# Patient Record
Sex: Female | Born: 1975 | Hispanic: No | Marital: Single | Smoking: Never smoker
Health system: Southern US, Community
[De-identification: ages and names within clinical notes are randomized; demographics above are authoritative.]

## PROBLEM LIST (undated history)

## (undated) DIAGNOSIS — F419 Anxiety disorder, unspecified: Secondary | ICD-10-CM

## (undated) DIAGNOSIS — D649 Anemia, unspecified: Secondary | ICD-10-CM

## (undated) DIAGNOSIS — B999 Unspecified infectious disease: Secondary | ICD-10-CM

## (undated) DIAGNOSIS — R87629 Unspecified abnormal cytological findings in specimens from vagina: Secondary | ICD-10-CM

## (undated) DIAGNOSIS — K819 Cholecystitis, unspecified: Secondary | ICD-10-CM

## (undated) HISTORY — PX: TYMPANOSTOMY TUBE PLACEMENT: SHX32

---

## 2015-10-07 ENCOUNTER — Inpatient Hospital Stay (HOSPITAL_COMMUNITY)
Admission: AD | Admit: 2015-10-07 | Discharge: 2015-10-07 | Disposition: A | Discharge status: Home / Self Care | Payer: BLUE CROSS/BLUE SHIELD | Source: Ambulatory Visit | Attending: Obstetrics and Gynecology | Admitting: Obstetrics and Gynecology

## 2015-10-07 ENCOUNTER — Encounter (HOSPITAL_COMMUNITY): Payer: Self-pay | Admitting: *Deleted

## 2015-10-07 DIAGNOSIS — N939 Abnormal uterine and vaginal bleeding, unspecified: Secondary | ICD-10-CM | POA: Insufficient documentation

## 2015-10-07 DIAGNOSIS — N926 Irregular menstruation, unspecified: Secondary | ICD-10-CM | POA: Diagnosis not present

## 2015-10-07 HISTORY — DX: Anxiety disorder, unspecified: F41.9

## 2015-10-07 HISTORY — DX: Unspecified infectious disease: B99.9

## 2015-10-07 HISTORY — DX: Unspecified abnormal cytological findings in specimens from vagina: R87.629

## 2015-10-07 HISTORY — DX: Anemia, unspecified: D64.9

## 2015-10-07 LAB — URINALYSIS, ROUTINE W REFLEX MICROSCOPIC
BILIRUBIN URINE: NEGATIVE
Glucose, UA: NEGATIVE mg/dL
Ketones, ur: NEGATIVE mg/dL
NITRITE: NEGATIVE
PH: 5.5 (ref 5.0–8.0)
Protein, ur: 30 mg/dL — AB
SPECIFIC GRAVITY, URINE: 1.02 (ref 1.005–1.030)

## 2015-10-07 LAB — URINE MICROSCOPIC-ADD ON

## 2015-10-07 LAB — POCT PREGNANCY, URINE: Preg Test, Ur: NEGATIVE

## 2015-10-07 NOTE — MAU Provider Note (Signed)
MAU HISTORY AND PHYSICAL  Chief Complaint:  Vaginal Bleeding   Amber Parks is a 39 y.o.  G0P0000 presenting for Vaginal Bleeding  Yesterday passed a tangerine-sized clot with menses. Thought there might have been tissue in it to suggest pregnancy. Often has clots with menses. On OCPs for approximately one month ago. Prescribed by [facility] family physicians here in town. This is the placebo week. Bleeding this week is actually less than normal, "far less than what it was prior to starting the pill." Normal period cramping. No lightheadedness or syncope or palpitations. Is compliant with OCPs. One sexual partner, female. Today just spotting.  She shows me the clot in a plastic bag, it is quarter sized.  Past Medical History  Diagnosis Date  . Anemia   . Infection     UTI  . Anxiety   . Vaginal Pap smear, abnormal     f/u ok    Past Surgical History  Procedure Laterality Date  . Tympanostomy tube placement      Family History  Problem Relation Age of Onset  . Hypertension Mother   . Stroke Mother   . Hyperlipidemia Father   . Hypertension Maternal Grandmother   . Pulmonary fibrosis Maternal Grandmother     Social History  Substance Use Topics  . Smoking status: Never Smoker   . Smokeless tobacco: Never Used  . Alcohol Use: No    Allergies not on file  No prescriptions prior to admission    Review of Systems - Negative except for what is mentioned in HPI.  Physical Exam  Blood pressure 141/92, pulse 102, temperature 98.4 F (36.9 C), temperature source Oral, resp. rate 18, height 5' (1.524 m), weight 162 lb (73.483 kg), last menstrual period 10/04/2015. GENERAL: Well-developed, well-nourished female in no acute distress.  LUNGS: Clear to auscultation bilaterally.  HEART: Regular rate and rhythm. ABDOMEN: Soft, nontender, nondistended, gravid.  EXTREMITIES: Nontender, no edema    Labs: Results for orders placed or performed during the hospital encounter of 10/07/15  (from the past 24 hour(s))  Urinalysis, Routine w reflex microscopic (not at [facility])   Collection Time: 10/07/15  9:45 AM  Result Value Ref Range   Color, Urine RED (A) YELLOW   APPearance HAZY (A) CLEAR   Specific Gravity, Urine 1.020 1.005 - 1.030   pH 5.5 5.0 - 8.0   Glucose, UA NEGATIVE NEGATIVE mg/dL   Hgb urine dipstick LARGE (A) NEGATIVE   Bilirubin Urine NEGATIVE NEGATIVE   Ketones, ur NEGATIVE NEGATIVE mg/dL   Protein, ur 30 (A) NEGATIVE mg/dL   Nitrite NEGATIVE NEGATIVE   Leukocytes, UA TRACE (A) NEGATIVE  Urine microscopic-add on   Collection Time: 10/07/15  9:45 AM  Result Value Ref Range   Squamous Epithelial / LPF 0-5 (A) NONE SEEN   WBC, UA 0-5 0 - 5 WBC/hpf   RBC / HPF TOO NUMEROUS TO COUNT 0 - 5 RBC/hpf   Bacteria, UA RARE (A) NONE SEEN  Pregnancy, urine POC   Collection Time: 10/07/15  9:50 AM  Result Value Ref Range   Preg Test, Ur NEGATIVE NEGATIVE    Imaging Studies:  No results found.  Assessment: Amber Parks is  39 y.o. G0P0000 who presents with concern for SAB. This appears to be normal menstruation as has been compliant with OCPs, total menstruation this week is less than normal, and Upreg is negative. Thought she saw a fetus in small blood clot she showed me; I explained that a UPT would assuredly be  positive if gestation far enough along for recognizable fetus to be present. Reports history heavy periods; says this period less heavy than prior to starting OCPs, no significant vital sign instability, so do not think this represents hemodynamically significant uterine bleeding.  Plan: - bleeding return precautions - continue OCPs - PCP f/u  Silvano BilisNoah B Wouk 12/26/201610:27 AM

## 2015-10-07 NOTE — Discharge Instructions (Signed)

## 2015-10-07 NOTE — MAU Note (Signed)
Started on birth control about a month ago.  Last night when used the restroom, passed a large tissue like clot. Was spotting prior.  Is still bleeding. This is her off week of birth control

## 2017-04-13 ENCOUNTER — Ambulatory Visit (INDEPENDENT_AMBULATORY_CARE_PROVIDER_SITE_OTHER): Payer: BLUE CROSS/BLUE SHIELD | Admitting: Allergy and Immunology

## 2017-04-13 ENCOUNTER — Encounter: Payer: Self-pay | Admitting: Allergy and Immunology

## 2017-04-13 VITALS — BP 138/72 | HR 80 | Temp 98.7°F | Resp 18 | Ht 60.0 in | Wt 181.0 lb

## 2017-04-13 DIAGNOSIS — K219 Gastro-esophageal reflux disease without esophagitis: Secondary | ICD-10-CM

## 2017-04-13 DIAGNOSIS — J4520 Mild intermittent asthma, uncomplicated: Secondary | ICD-10-CM

## 2017-04-13 DIAGNOSIS — J3089 Other allergic rhinitis: Secondary | ICD-10-CM

## 2017-04-13 DIAGNOSIS — Z91018 Allergy to other foods: Secondary | ICD-10-CM | POA: Diagnosis not present

## 2017-04-13 MED ORDER — MONTELUKAST SODIUM 10 MG PO TABS: ORAL_TABLET | ORAL | 5 refills | Status: DC

## 2017-04-13 MED ORDER — OMEPRAZOLE 40 MG PO CPDR: 40.0000 mg | DELAYED_RELEASE_CAPSULE | ORAL | 5 refills | Status: DC

## 2017-04-13 MED ORDER — RANITIDINE HCL 300 MG PO TABS: 300.0000 mg | ORAL_TABLET | Freq: Every day | ORAL | 5 refills | Status: DC

## 2017-04-13 MED ORDER — AUVI-Q 0.3 MG/0.3ML IJ SOAJ: INTRAMUSCULAR | 3 refills | Status: AC

## 2017-04-13 NOTE — Progress Notes (Signed)
NEW PATIENT NOTE  Referring Provider: No ref. provider found Primary Provider: Ilona Sorrel, NP Date of office visit: 04/13/2017    Subjective:   Chief Complaint:  Amber Parks (DOB: 06/27/1976) is a 41 y.o. female who presents to the clinic on 04/13/2017 with a chief complaint of Cough (Mucus in throat after eating.) .  HPI: Amber Parks presents to this clinic in evaluation of 4 main issues.  First, she has nasal congestion and sneezing and nose blowing that appears to occur throughout the year and maybe somewhat worse during the spring. There does not appear to be any obvious provoking factor giving rise to this issue. She utilizes a combination of Flonase and Allegra which appears to help her somewhat. She does not have any associated ugly nasal discharge or anosmia or headaches.  Second, she has a history of developing postnasal drip and throat clearing and gagging and retching and vomiting in association with coughing spells. She does not have any shortness of breath or chest pain or sputum production. She thinks that this is somewhat helped by using Allegra but this still occurs at least one time per day. She thinks that she is worse when she is eating. Spicy foods appear to precipitate this issue. She drinks 32 ounces of tea per day and does not really consume any chocolate or alcohol.  Third, she was apparently given a bronchodilator this May during a respiratory tract infection that was associated with a fever. Otherwise, she does not really have any cold air induced bronchospastic symptoms or exercise induced bronchospastic symptoms  Fourth, she has a history of developing a reaction to pineapple. She has been pineapple free for the past year. When she eats pineapple she gets diffuse itchiness and itchiness of her tongue.  Past Medical History:  Diagnosis Date  . Anemia   . Anxiety   . Infection    UTI  . Vaginal Pap smear, abnormal    f/u ok    Past Surgical  History:  Procedure Laterality Date  . TYMPANOSTOMY TUBE PLACEMENT      Allergies as of 04/13/2017      Reactions   Moxifloxacin Anxiety   Aspirin Nausea And Vomiting   Chlorthalidone Other (See Comments)   Increased heart rate   Sulfamethoxazole-trimethoprim Nausea And Vomiting   Zoloft [sertraline] Other (See Comments)   Took 1 pill and caused several different problems, HIGHLY allergic! Gives MS symptoms   Ibuprofen Nausea Only      Medication List      buPROPion 150 MG 24 hr tablet Commonly known as:  WELLBUTRIN XL Take 150 mg by mouth daily.   calcium citrate-vitamin D 315-200 MG-UNIT tablet Commonly known as:  CITRACAL+D Take 1 tablet by mouth daily.   clotrimazole-betamethasone cream Commonly known as:  LOTRISONE Apply topically.   Cranberry-Vitamin C-Vitamin E 140-100-3 MG-MG-UNIT Caps Take 2 by mouth daily   desonide 0.05 % cream Commonly known as:  DESOWEN Two (2) times a day. Apply to affected area.   ferrous sulfate 325 (65 FE) MG tablet Take 325 mg by mouth 2 (two) times daily with a meal.   fexofenadine 180 MG tablet Commonly known as:  ALLEGRA Take 180 mg by mouth daily.   FLONASE SENSIMIST 27.5 MCG/SPRAY nasal spray Generic drug:  fluticasone Place 1 spray into the nose daily.   gabapentin 100 MG capsule Commonly known as:  NEURONTIN TK ONE C PO D   losartan 25 MG tablet Commonly known as:  COZAAR Take 1 tablet by mouth daily.   magic mouthwash Soln 80ml each: Viscous Lidocaine, Mylanta, Benadryl 12.5mg /45ml, Nystatin Oral Susp, Prednisolone 15mg /335ml & Carafate 1gr/4710ml; 10ml q4h prn   NIKKI 3-0.02 MG tablet Generic drug:  drospirenone-ethinyl estradiol TAKE 1 TABLET BY MOUTH EVERY DAY   PROAIR HFA 108 (90 Base) MCG/ACT inhaler Generic drug:  albuterol Inhale into the lungs.   Vitamin B-12 1000 MCG Subl Take one by mouth daily   vitamin C 500 MG tablet Commonly known as:  ASCORBIC ACID Take 500 mg by mouth daily.   Vitamin D  (Ergocalciferol) 50000 units Caps capsule Commonly known as:  DRISDOL TK 1 C PO 3 TIMES A WK       Review of systems negative except as noted in HPI / PMHx or noted below:  Review of Systems  Constitutional: Negative.   HENT: Negative.   Eyes: Negative.   Respiratory: Negative.   Cardiovascular: Negative.   Gastrointestinal: Negative.   Genitourinary: Negative.   Musculoskeletal: Negative.   Skin: Negative.   Neurological: Negative.   Endo/Heme/Allergies: Negative.   Psychiatric/Behavioral: Negative.     Family History  Problem Relation Age of Onset  . Hypertension Mother   . Stroke Mother   . Hyperlipidemia Father   . Hypertension Maternal Grandmother   . Pulmonary fibrosis Maternal Grandmother   . Breast cancer Paternal Grandmother     Social History   Social History  . Marital status: Single    Spouse name: N/A  . Number of children: N/A  . Years of education: N/A   Occupational History  . Not on file.   Social History Main Topics  . Smoking status: Never Smoker  . Smokeless tobacco: Never Used  . Alcohol use No  . Drug use: No  . Sexual activity: Yes    Birth control/ protection: Pill   Other Topics Concern  . Not on file   Social History Narrative  . No narrative on file    Environmental and Social history  Lives in a townhouse with a dry environment, cats located inside the household, carpeting in the bedroom, plastic on the bed, lasting on the pillow, no smokers inside the household, and she is a Consulting civil engineerstudent.  Objective:   Vitals:   04/13/17 0816  BP: 138/72  Pulse: 80  Resp: 18  Temp: 98.7 F (37.1 C)   Height: 5' (152.4 cm) Weight: 181 lb (82.1 kg)  Physical Exam  Constitutional: She is well-developed, well-nourished, and in no distress.  HENT:  Head: Normocephalic. Head is without right periorbital erythema and without left periorbital erythema.  Right Ear: Tympanic membrane, external ear and ear canal normal.  Left Ear: Tympanic  membrane, external ear and ear canal normal.  Nose: Nose normal. No mucosal edema or rhinorrhea.  Mouth/Throat: Uvula is midline, oropharynx is clear and moist and mucous membranes are normal. No oropharyngeal exudate.  Eyes: Conjunctivae and lids are normal. Pupils are equal, round, and reactive to light.  Neck: Trachea normal. No tracheal tenderness present. No tracheal deviation present. No thyromegaly present.  Cardiovascular: Normal rate, regular rhythm, S1 normal, S2 normal and normal heart sounds.   No murmur heard. Pulmonary/Chest: Effort normal and breath sounds normal. No stridor. No tachypnea. No respiratory distress. She has no wheezes. She has no rales. She exhibits no tenderness.  Abdominal: Soft. She exhibits no distension and no mass. There is no hepatosplenomegaly. There is no tenderness. There is no rebound and no guarding.  Musculoskeletal: She exhibits  no edema or tenderness.  Lymphadenopathy:       Head (right side): No tonsillar adenopathy present.       Head (left side): No tonsillar adenopathy present.    She has no cervical adenopathy.    She has no axillary adenopathy.  Neurological: She is alert. Gait normal.  Skin: No rash noted. She is not diaphoretic. No erythema. No pallor. Nails show no clubbing.  Psychiatric: Mood and affect normal.  Vitals reviewed.   Diagnostics: Allergy skin tests were performed. She did not demonstrate any hypersensitivity against a screening panel of aeroallergens or foods.  Spirometry was performed and demonstrated an FEV1 of 2.27 @ 87 % of predicted.  Results of blood tests obtained 26 January 2017 identified a white blood cell count of 9.8 with a relatively normal differential and an absolute eosinophil count of 200, hemoglobin 14.0, platelets 315  Assessment and Plan:    1. Asthma, mild intermittent, well-controlled   2. Other allergic rhinitis   3. Food allergy   4. LPRD (laryngopharyngeal reflux disease)     1. Allergen  avoidance measures?  2. Treat and prevent inflammation:   A. montelukast 10 mg - one tablet one time per day  B. Flonase or similar - 1-2 sprays each nostril one time per day  3. Treat and prevent reflux:   A. slowly taper off all forms of caffeine  B. omeprazole 40 mg - one tablet in a.m.  C. ranitidine 300 mg - one tablet in PM  4. If needed:   A. OTC nasal saline  B. OTC antihistamine - Allegra  C. Auvi-Q, Benadryl, M.D./ER for allergic reaction  D. ProAir HFA 2 puffs every 4-6 hours  5. Return to clinic in 4 weeks or earlier if problem  I think that Liberti has a combination of irritation and inflammation of her respiratory tract most likely with reflux being one of her major triggers and we will aggressively treat this condition with the therapy noted above at the same time that she utilizes anti-inflammatory agents for her respiratory tract. I've also given her a epinephrine delivery device to address but may be a significant issue with pineapple allergy. We may need to obtain a zone 2 aero allergen profile and a pineapple IgE pending her response to therapy noted above. I will see her back in this clinic in 4 weeks or earlier if there is a problem.  Laurette Schimke, MD Allergy / Immunology [facility]

## 2017-04-13 NOTE — Patient Instructions (Addendum)
  1. Allergen avoidance measures?  2. Treat and prevent inflammation:   A. montelukast 10 mg - one tablet one time per day  B. Flonase or similar - 1-2 sprays each nostril one time per day  3. Treat and prevent reflux:   A. slowly taper off all forms of caffeine  B. omeprazole 40 mg - one tablet in a.m.  C. ranitidine 300 mg - one tablet in PM  4. If needed:   A. OTC nasal saline  B. OTC antihistamine - Allegra  C. Auvi-Q, Benadryl, M.D./ER for allergic reaction  D. ProAir HFA 2 puffs every 4-6 hours  5. Return to clinic in 4 weeks or earlier if problem

## 2017-05-11 ENCOUNTER — Encounter: Payer: Self-pay | Admitting: Allergy and Immunology

## 2017-05-11 ENCOUNTER — Ambulatory Visit (INDEPENDENT_AMBULATORY_CARE_PROVIDER_SITE_OTHER): Payer: BLUE CROSS/BLUE SHIELD | Admitting: Allergy and Immunology

## 2017-05-11 VITALS — BP 128/90 | HR 84 | Resp 20

## 2017-05-11 DIAGNOSIS — Z91018 Allergy to other foods: Secondary | ICD-10-CM | POA: Diagnosis not present

## 2017-05-11 DIAGNOSIS — J3089 Other allergic rhinitis: Secondary | ICD-10-CM | POA: Diagnosis not present

## 2017-05-11 DIAGNOSIS — K219 Gastro-esophageal reflux disease without esophagitis: Secondary | ICD-10-CM | POA: Diagnosis not present

## 2017-05-11 DIAGNOSIS — J4520 Mild intermittent asthma, uncomplicated: Secondary | ICD-10-CM | POA: Diagnosis not present

## 2017-05-11 NOTE — Progress Notes (Signed)
Follow-up Note  Referring Provider: Ilona SorrelAdams, Amie Smith, NP Primary Provider: Ilona SorrelAdams, Amie Smith, NP Date of Office Visit: 05/11/2017  Subjective:   Amber Parks (DOB: 1976-08-14) is a 41 y.o. female who [facility] in re-evaluation of the following:  HPI: Amber Parks returns to this clinic in reevaluation of her persistent respiratory tract symptoms and history of food allergy addressed during her initial evaluation of 04/13/2017.  She is significantly improved and has resolved her postnasal drip and her throat clearing and her gagging and retching and vomiting and coughing spells and has not had to use a bronchodilator. She has no issues with reflux.  She has been very good about consolidating all of her caffeine consumption but still continues to drink some tea.  She remains away from eating pineapple.  Allergies as of 05/11/2017      Reactions   Moxifloxacin Anxiety   Aspirin Nausea And Vomiting   Chlorthalidone Other (See Comments)   Increased heart rate   Sulfamethoxazole-trimethoprim Nausea And Vomiting   Zoloft [sertraline] Other (See Comments)   Took 1 pill and caused several different problems, HIGHLY allergic! Gives MS symptoms   Ibuprofen Nausea Only      Medication List      AUVI-Q 0.3 mg/0.3 mL Soaj injection Generic drug:  EPINEPHrine Use as directed for life-threatening allergic reaction.   buPROPion 150 MG 24 hr tablet Commonly known as:  WELLBUTRIN XL Take 150 mg by mouth daily.   clotrimazole 10 MG troche Commonly known as:  MYCELEX   clotrimazole-betamethasone cream Commonly known as:  LOTRISONE Apply topically.   Cranberry-Vitamin C-Vitamin E 140-100-3 MG-MG-UNIT Caps Take 2 by mouth daily   desonide 0.05 % cream Commonly known as:  DESOWEN Two (2) times a day. Apply to affected area.   ferrous sulfate 325 (65 FE) MG tablet Take 325 mg by mouth 2 (two) times daily with a meal.   fexofenadine  180 MG tablet Commonly known as:  ALLEGRA Take 180 mg by mouth daily.   FLONASE SENSIMIST 27.5 MCG/SPRAY nasal spray Generic drug:  fluticasone Place 1 spray into the nose daily.   gabapentin 100 MG capsule Commonly known as:  NEURONTIN TK ONE C PO D   losartan 25 MG tablet Commonly known as:  COZAAR Take 1 tablet by mouth daily.   montelukast 10 MG tablet Commonly known as:  SINGULAIR Take one tablet by mouth once daily.   NIKKI 3-0.02 MG tablet Generic drug:  drospirenone-ethinyl estradiol TAKE 1 TABLET BY MOUTH EVERY DAY   omeprazole 40 MG capsule Commonly known as:  PRILOSEC Take 1 capsule (40 mg total) by mouth every morning.   PROAIR HFA 108 (90 Base) MCG/ACT inhaler Generic drug:  albuterol Inhale into the lungs.   ranitidine 300 MG tablet Commonly known as:  ZANTAC Take 1 tablet (300 mg total) by mouth at bedtime.   Vitamin B-12 1000 MCG Subl Take one by mouth daily   vitamin C 500 MG tablet Commonly known as:  ASCORBIC ACID Take 500 mg by mouth daily.   Vitamin D (Ergocalciferol) 50000 units Caps capsule Commonly known as:  DRISDOL TK 1 C PO 3 TIMES A WK       Past Medical History:  Diagnosis Date  . Anemia   . Anxiety   . Infection    UTI  . Vaginal Pap smear, abnormal    f/u ok    Past Surgical History:  Procedure Laterality Date  .  TYMPANOSTOMY TUBE PLACEMENT      Review of systems negative except as noted in HPI / PMHx or noted below:  Review of Systems  Constitutional: Negative.   HENT: Negative.   Eyes: Negative.   Respiratory: Negative.   Cardiovascular: Negative.   Gastrointestinal: Negative.   Genitourinary: Negative.   Musculoskeletal: Negative.   Skin: Negative.   Neurological: Negative.   Endo/Heme/Allergies: Negative.   Psychiatric/Behavioral: Negative.      Objective:   Vitals:   05/11/17 0925  BP: 128/90  Pulse: 84  Resp: 20          Physical Exam  Constitutional: She is well-developed,  well-nourished, and in no distress.  HENT:  Head: Normocephalic.  Right Ear: Tympanic membrane, external ear and ear canal normal.  Left Ear: Tympanic membrane, external ear and ear canal normal.  Nose: Nose normal. No mucosal edema or rhinorrhea.  Mouth/Throat: Uvula is midline, oropharynx is clear and moist and mucous membranes are normal. No oropharyngeal exudate.  Eyes: Conjunctivae are normal.  Neck: Trachea normal. No tracheal tenderness present. No tracheal deviation present. No thyromegaly present.  Cardiovascular: Normal rate, regular rhythm, S1 normal, S2 normal and normal heart sounds.   No murmur heard. Pulmonary/Chest: Breath sounds normal. No stridor. No respiratory distress. She has no wheezes. She has no rales.  Musculoskeletal: She exhibits no edema.  Lymphadenopathy:       Head (right side): No tonsillar adenopathy present.       Head (left side): No tonsillar adenopathy present.    She has no cervical adenopathy.  Neurological: She is alert. Gait normal.  Skin: No rash noted. She is not diaphoretic. No erythema. Nails show no clubbing.  Psychiatric: Mood and affect normal.    Diagnostics:    Spirometry was performed and demonstrated an FEV1 of 2.15 at 82 % of predicted.  The patient had an Asthma Control Test with the following results: ACT Total Score: 23.    Assessment and Plan:   1. Asthma, mild intermittent, well-controlled   2. Other allergic rhinitis   3. Food allergy   4. LPRD (laryngopharyngeal reflux disease)     1. Continue to Treat and prevent inflammation:   A. montelukast 10 mg - one tablet one time per day  B. Flonase or similar - 1-2 sprays each nostril one time per day  3. Continue to Treat and prevent reflux:   A. slowly taper off all forms of caffeine  B. omeprazole 40 mg - one tablet in a.m.  C. ranitidine 300 mg - one tablet in PM  4. If needed:   A. OTC nasal saline  B. OTC antihistamine - Allegra  C. Auvi-Q, Benadryl, M.D./ER  for allergic reaction  D. ProAir HFA 2 puffs every 4-6 hours  5. Return to clinic in 12 weeks or earlier if problem  6. Obtain fall flu vaccine  Amber Parks has really done quite well with initial therapy and she will continue to aggressively treat reflux as noted above as well as using anti-inflammatory agents for her respiratory tract and I will regroup with her in 12 weeks or earlier if there is a problem. She will remain away from pineapple at this point.  Laurette SchimkeEric Kozlow, MD Allergy / Immunology [facility]

## 2017-05-11 NOTE — Patient Instructions (Addendum)
  1. Continue to Treat and prevent inflammation:   A. montelukast 10 mg - one tablet one time per day  B. Flonase or similar - 1-2 sprays each nostril one time per day  3. Continue to Treat and prevent reflux:   A. slowly taper off all forms of caffeine  B. omeprazole 40 mg - one tablet in a.m.  C. ranitidine 300 mg - one tablet in PM  4. If needed:   A. OTC nasal saline  B. OTC antihistamine - Allegra  C. Auvi-Q, Benadryl, M.D./ER for allergic reaction  D. ProAir HFA 2 puffs every 4-6 hours  5. Return to clinic in 12 weeks or earlier if problem  6. Obtain fall flu vaccine

## 2017-07-01 ENCOUNTER — Telehealth: Payer: Self-pay | Admitting: Allergy and Immunology

## 2017-07-01 NOTE — Telephone Encounter (Signed)
Patient said Dr. Lucie Leather has her on Prilosec since July. She has had some kidney issues and has been treated by her PCP for this. She knows that is a side effect of the Prilosec and would like to know if she should stop taking it. She has not taken any this morning. She is waiting for a call back to know if she should take it today or stop all together.

## 2017-07-01 NOTE — Telephone Encounter (Signed)
Called and spoke with patient she said she will try the options and let us know which works better for her.

## 2017-07-01 NOTE — Telephone Encounter (Signed)
Please advise 

## 2017-07-01 NOTE — Telephone Encounter (Signed)
Please inform patient that she can attempt to discontinue omeprazole while remaining on ranitidine and see what happens regarding her reflux.

## 2017-07-01 NOTE — Telephone Encounter (Signed)
Called and spoke with patient regarding this matter. She stated that her PCP had just called and told her to go ahead and stop taking the omeprazole, however she is concerned about her reflux because this morning was the first day not taking the omeprazole and she had some stomach issues and feeling nauseous. She would like to know if she can split fer 40 mg dose into 20 mg in am and 20 mg in pm or if there was something else she could do. Please advise.

## 2017-07-01 NOTE — Telephone Encounter (Signed)
Please informed patient that instead of discontinuing omeprazole she can use a lower dose. She will just need to try these options and let us know what happens.

## 2017-07-02 MED ORDER — RANITIDINE HCL 150 MG PO TABS: 150.0000 mg | ORAL_TABLET | Freq: Two times a day (BID) | ORAL | 5 refills | Status: DC

## 2017-07-02 NOTE — Telephone Encounter (Signed)
Pt called back stating she will take Zantac  BID. Pt requested [place] to send in a new rx for Zantac  BID.

## 2017-09-01 ENCOUNTER — Ambulatory Visit: Payer: BLUE CROSS/BLUE SHIELD | Admitting: Allergy and Immunology

## 2017-09-01 ENCOUNTER — Encounter: Payer: Self-pay | Admitting: Allergy and Immunology

## 2017-09-01 VITALS — BP 126/70 | HR 95 | Resp 17

## 2017-09-01 DIAGNOSIS — K219 Gastro-esophageal reflux disease without esophagitis: Secondary | ICD-10-CM

## 2017-09-01 DIAGNOSIS — Z91018 Allergy to other foods: Secondary | ICD-10-CM | POA: Diagnosis not present

## 2017-09-01 DIAGNOSIS — J4520 Mild intermittent asthma, uncomplicated: Secondary | ICD-10-CM

## 2017-09-01 DIAGNOSIS — J3089 Other allergic rhinitis: Secondary | ICD-10-CM | POA: Diagnosis not present

## 2017-09-06 NOTE — Progress Notes (Signed)
Follow-up Note  Referring Provider: Ilona SorrelAdams, Amie Smith, NP Primary Provider: Ilona SorrelAdams, Amie Smith, NP Date of Office Visit: 09/01/2017  Subjective:   Amber Parks (DOB: Oct 31, 1975) is a 41 y.o. 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  Her last visit to this clinic was 11 May 2017.  She has had no problems with her airway.  She rarely uses a short acting bronchodilator.  She continues on Flonase several days per week and uses montelukast.  She has not required a systemic steroid or an antibiotic to address any type of respiratory tract issue.  Her reflux is now under excellent control and she has had very little issues with her throat.  She has consolidated her therapy for reflux and is now predominantly using ranitidine.  She is very careful about consuming caffeine.  She remains away from consuming pineapple.  Allergies as of 09/01/2017      Reactions   Moxifloxacin Anxiety   Aspirin Nausea And Vomiting   Chlorthalidone Other (See Comments)   Increased heart rate   Sulfamethoxazole-trimethoprim Nausea And Vomiting   Zoloft [sertraline] Other (See Comments)   Took 1 pill and caused several different problems, HIGHLY allergic! Gives MS symptoms   Ibuprofen Nausea Only      Medication List      AUVI-Q 0.3 mg/0.3 mL Soaj injection Generic drug:  EPINEPHrine Use as directed for life-threatening allergic reaction.   buPROPion 150 MG 24 hr tablet Commonly known as:  WELLBUTRIN XL Take 150 mg by mouth daily.   clotrimazole 10 MG troche Commonly known as:  MYCELEX   Cranberry-Vitamin C-Vitamin E 140-100-3 MG-MG-UNIT Caps Take 2 by mouth daily   desonide 0.05 % cream Commonly known as:  DESOWEN Two (2) times a day. Apply to affected area.   ferrous  sulfate 325 (65 FE) MG tablet Take 325 mg by mouth 2 (two) times daily with a meal.   fexofenadine 180 MG tablet Commonly known as:  ALLEGRA Take 180 mg by mouth daily.   FLONASE SENSIMIST 27.5 MCG/SPRAY nasal spray Generic drug:  fluticasone Place 1 spray into the nose daily.   gabapentin 100 MG capsule Commonly known as:  NEURONTIN TK ONE C PO D   losartan 25 MG tablet Commonly known as:  COZAAR Take 1 tablet by mouth daily.   montelukast 10 MG tablet Commonly known as:  SINGULAIR Take one tablet by mouth once daily.   NIKKI 3-0.02 MG tablet Generic drug:  drospirenone-ethinyl estradiol TAKE 1 TABLET BY MOUTH EVERY DAY   PROAIR HFA 108 (90 Base) MCG/ACT inhaler Generic drug:  albuterol Inhale into the lungs.   ranitidine 150 MG tablet Commonly known as:  ZANTAC Take 1 tablet (150 mg total) by mouth 2 (two) times daily.   Vitamin B-12 1000 MCG Subl Take one by mouth daily   vitamin C 500 MG tablet Commonly known as:  ASCORBIC ACID Take 500 mg by mouth daily.   Vitamin D (Ergocalciferol) 50000 units Caps capsule Commonly known as:  DRISDOL TK 1 C PO 3 TIMES A WK       Past Medical History:  Diagnosis Date  . Anemia   . Anxiety   . Infection    UTI  . Vaginal Pap smear, abnormal    f/u ok  Past Surgical History:  Procedure Laterality Date  . TYMPANOSTOMY TUBE PLACEMENT      Review of systems negative except as noted in HPI / PMHx or noted below:  Review of Systems  Constitutional: Negative.   HENT: Negative.   Eyes: Negative.   Respiratory: Negative.   Cardiovascular: Negative.   Gastrointestinal: Negative.   Genitourinary: Negative.   Musculoskeletal: Negative.   Skin: Negative.   Neurological: Negative.   Endo/Heme/Allergies: Negative.   Psychiatric/Behavioral: Negative.      Objective:   Vitals:   09/01/17 1027  BP: 126/70  Pulse: 95  Resp: 17  SpO2: 97%          Physical Exam  Constitutional: She is well-developed,  well-nourished, and in no distress.  HENT:  Head: Normocephalic.  Right Ear: Tympanic membrane, external ear and ear canal normal.  Left Ear: Tympanic membrane, external ear and ear canal normal.  Nose: Nose normal. No mucosal edema or rhinorrhea.  Mouth/Throat: Uvula is midline, oropharynx is clear and moist and mucous membranes are normal. No oropharyngeal exudate.  Eyes: Conjunctivae are normal.  Neck: Trachea normal. No tracheal tenderness present. No tracheal deviation present. No thyromegaly present.  Cardiovascular: Normal rate, regular rhythm, S1 normal, S2 normal and normal heart sounds.  No murmur heard. Pulmonary/Chest: Breath sounds normal. No stridor. No respiratory distress. She has no wheezes. She has no rales.  Musculoskeletal: She exhibits no edema.  Lymphadenopathy:       Head (right side): No tonsillar adenopathy present.       Head (left side): No tonsillar adenopathy present.    She has no cervical adenopathy.  Neurological: She is alert. Gait normal.  Skin: No rash noted. She is not diaphoretic. No erythema. Nails show no clubbing.  Psychiatric: Mood and affect normal.    Diagnostics:   The patient had an Asthma Control Test with the following results: ACT Total Score: 25.    Assessment and Plan:   1. Asthma, mild intermittent, well-controlled   2. Other allergic rhinitis   3. Food allergy   4. LPRD (laryngopharyngeal reflux disease)     1. Continue to Treat and prevent inflammation:   A. montelukast 10 mg - one tablet one time per day  B. Flonase  1-2 sprays each nostril one time per day  3. Continue to Treat and prevent reflux:   A. slowly taper off all forms of caffeine  B. ranitidine 300 mg daily  4. If needed:   A. OTC nasal saline  B. OTC antihistamine - Allegra  C. Auvi-Q, Benadryl, M.D./ER for allergic reaction  D. ProAir HFA 2 puffs every 4-6 hours  5. Return to clinic in 1 year or earlier if problem  6. Obtain fall flu  vaccine  Amber Parks has really done much better since she has addressed the issues with her atopic and reflux induced respiratory disease and she has a very good understanding of how to treat these conditions with the appropriate medications.  We will now see her back in this clinic in 1 year or earlier if there is a problem.  Laurette SchimkeEric Estellar Cadena, MD Allergy / Immunology [facility]

## 2017-09-06 NOTE — Patient Instructions (Addendum)
  1. Continue to Treat and prevent inflammation:   A. montelukast 10 mg - one tablet one time per day  B. Flonase  1-2 sprays each nostril one time per day  3. Continue to Treat and prevent reflux:   A. slowly taper off all forms of caffeine  B. ranitidine 300 mg daily  4. If needed:   A. OTC nasal saline  B. OTC antihistamine - Allegra  C. Auvi-Q, Benadryl, M.D./ER for allergic reaction  D. ProAir HFA 2 puffs every 4-6 hours  5. Return to clinic in 1 year or earlier if problem  6. Obtain fall flu vaccine

## 2017-09-07 ENCOUNTER — Encounter: Payer: Self-pay | Admitting: Allergy and Immunology

## 2017-10-13 ENCOUNTER — Other Ambulatory Visit: Payer: Self-pay

## 2017-10-13 ENCOUNTER — Other Ambulatory Visit: Payer: Self-pay | Admitting: Allergy and Immunology

## 2017-10-13 MED ORDER — MONTELUKAST SODIUM 10 MG PO TABS: ORAL_TABLET | ORAL | 3 refills | Status: DC

## 2017-10-13 NOTE — Telephone Encounter (Signed)
RX for Montelukast sent into pharmacy for Pt.

## 2017-10-18 ENCOUNTER — Other Ambulatory Visit: Payer: Self-pay | Admitting: Allergy and Immunology

## 2017-10-18 MED ORDER — MONTELUKAST SODIUM 10 MG PO TABS: ORAL_TABLET | ORAL | 3 refills | Status: DC

## 2017-10-18 NOTE — Telephone Encounter (Signed)
Patient called and said a prescription had been sent in for Montelukast, but was sent to the wrong pharmacy. [facility] [address]. She only has 2 pills left.  [facility] and she doesn't use that pharmacy.

## 2017-10-18 NOTE — Telephone Encounter (Signed)
rx sent in 

## 2017-12-16 ENCOUNTER — Telehealth: Payer: Self-pay

## 2017-12-16 NOTE — Telephone Encounter (Signed)
{  Patient called this morning and said that Dr. Lucie LeatherKozlow had told her that she may be able to have the Hepatitis B and Varicella titers. She is seeing her PCP today and may have them there. She will call us back and let us know for sure.

## 2018-02-11 ENCOUNTER — Other Ambulatory Visit: Payer: Self-pay | Admitting: Allergy and Immunology

## 2018-02-15 ENCOUNTER — Other Ambulatory Visit: Payer: Self-pay | Admitting: Allergy and Immunology

## 2018-06-18 ENCOUNTER — Emergency Department (HOSPITAL_COMMUNITY)
Admission: EM | Admit: 2018-06-18 | Discharge: 2018-06-18 | Disposition: A | Discharge status: Home / Self Care | Payer: BLUE CROSS/BLUE SHIELD | Source: Home / Self Care | Attending: Emergency Medicine | Admitting: Emergency Medicine

## 2018-06-18 ENCOUNTER — Emergency Department (HOSPITAL_COMMUNITY): Payer: BLUE CROSS/BLUE SHIELD

## 2018-06-18 ENCOUNTER — Other Ambulatory Visit: Payer: Self-pay

## 2018-06-18 ENCOUNTER — Encounter (HOSPITAL_COMMUNITY): Payer: Self-pay

## 2018-06-18 DIAGNOSIS — Z79899 Other long term (current) drug therapy: Secondary | ICD-10-CM

## 2018-06-18 DIAGNOSIS — K8020 Calculus of gallbladder without cholecystitis without obstruction: Secondary | ICD-10-CM

## 2018-06-18 DIAGNOSIS — R1011 Right upper quadrant pain: Secondary | ICD-10-CM | POA: Diagnosis not present

## 2018-06-18 DIAGNOSIS — K8010 Calculus of gallbladder with chronic cholecystitis without obstruction: Secondary | ICD-10-CM | POA: Diagnosis not present

## 2018-06-18 LAB — URINALYSIS, ROUTINE W REFLEX MICROSCOPIC
Bilirubin Urine: NEGATIVE
GLUCOSE, UA: NEGATIVE mg/dL
Hgb urine dipstick: NEGATIVE
KETONES UR: NEGATIVE mg/dL
LEUKOCYTES UA: NEGATIVE
Nitrite: NEGATIVE
PROTEIN: NEGATIVE mg/dL
Specific Gravity, Urine: 1.02 (ref 1.005–1.030)
pH: 8 (ref 5.0–8.0)

## 2018-06-18 LAB — CBC
HCT: 41.2 % (ref 36.0–46.0)
Hemoglobin: 13.7 g/dL (ref 12.0–15.0)
MCH: 30.9 pg (ref 26.0–34.0)
MCHC: 33.3 g/dL (ref 30.0–36.0)
MCV: 92.8 fL (ref 78.0–100.0)
PLATELETS: 350 10*3/uL (ref 150–400)
RBC: 4.44 MIL/uL (ref 3.87–5.11)
RDW: 12.9 % (ref 11.5–15.5)
WBC: 13.6 10*3/uL — AB (ref 4.0–10.5)

## 2018-06-18 LAB — I-STAT BETA HCG BLOOD, ED (MC, WL, AP ONLY): I-stat hCG, quantitative: <5 m[IU]/mL (ref <5)

## 2018-06-18 LAB — COMPREHENSIVE METABOLIC PANEL
ALK PHOS: 116 U/L (ref 38–126)
ALT: 53 U/L — AB (ref 0–44)
AST: 70 U/L — AB (ref 15–41)
Albumin: 3.8 g/dL (ref 3.5–5.0)
Anion gap: 10 (ref 5–15)
BILIRUBIN TOTAL: 0.9 mg/dL (ref 0.3–1.2)
BUN: 14 mg/dL (ref 6–20)
CALCIUM: 9.7 mg/dL (ref 8.9–10.3)
CO2: 25 mmol/L (ref 22–32)
CREATININE: 0.82 mg/dL (ref 0.44–1.00)
Chloride: 108 mmol/L (ref 98–111)
GFR calc Af Amer: >60 mL/min (ref >60)
Glucose, Bld: 98 mg/dL (ref 70–99)
Potassium: 4 mmol/L (ref 3.5–5.1)
Sodium: 143 mmol/L (ref 135–145)
TOTAL PROTEIN: 7.3 g/dL (ref 6.5–8.1)

## 2018-06-18 LAB — I-STAT TROPONIN, ED: Troponin i, poc: 0.02 ng/mL (ref 0.00–0.08)

## 2018-06-18 LAB — LIPASE, BLOOD: Lipase: 37 U/L (ref 11–51)

## 2018-06-18 MED ORDER — ONDANSETRON 4 MG PO TBDP: 4.0000 mg | ORAL_TABLET | Freq: Three times a day (TID) | ORAL | 0 refills | Status: AC | PRN

## 2018-06-18 MED ORDER — HYDROCODONE-ACETAMINOPHEN 5-325 MG PO TABS: 1.0000 | ORAL_TABLET | ORAL | 0 refills | Status: AC | PRN

## 2018-06-18 NOTE — ED Triage Notes (Signed)
Patient c/o bilateral upper abdominal pain and vomiting since this AM.

## 2018-06-18 NOTE — Discharge Instructions (Signed)
Follow up with general surgery. Return to the ER for worsening pain, fevers, ongoing vomiting, any concerning symptoms.  Zofran as needed as prescribed for nausea and vomiting. Norco as needed as prescribed for pain. Dietary restrictions as discussed.

## 2018-06-18 NOTE — ED Provider Notes (Signed)
[facility] DEPT Provider Note   CSN: [id number] Arrival date & time: 06/18/18  1257     History   Chief Complaint Chief Complaint  Patient presents with  . Abdominal Pain  . Emesis    HPI Amber Parks is a 42 y.o. female.  42yo female presents with complaint of upper abdominal pain. First episode of pain was about 2 weeks ago after eating a blooming onion, upper right and left abdominal areas, more so on the right, with nausea and vomiting. Patient went to her PCP who recommended an US of the gallbladder however patient has not had pain again until today after eating 1/2 a hamburger with baked potato with cheese and butter. Patient has reflux, tried taking her antacid without any relief. Denies fevers, chills, pain has resolved while waiting in the ER. No other complaints or concerns.      Past Medical History:  Diagnosis Date  . Anemia   . Anxiety   . Infection    UTI  . Vaginal Pap smear, abnormal    f/u ok    There are no active problems to display for this patient.   Past Surgical History:  Procedure Laterality Date  . TYMPANOSTOMY TUBE PLACEMENT       OB History    Gravida  0   Para  0   Term  0   Preterm  0   AB  0   Living  0     SAB  0   TAB  0   Ectopic  0   Multiple  0   Live Births               Home Medications    Prior to Admission medications   Medication Sig Start Date End Date Taking? Authorizing Provider  albuterol (PROAIR HFA) 108 (90 Base) MCG/ACT inhaler Inhale into the lungs. 11/15/15   [provider]  AUVI-Q 0.3 MG/0.3ML SOAJ injection Use as directed for life-threatening allergic reaction. 04/13/17   Kozlow, Donnamarie Poag, MD  buPROPion (WELLBUTRIN XL) 150 MG 24 hr tablet Take 150 mg by mouth daily.    [provider]  clotrimazole (MYCELEX) 10 MG troche  04/21/17   [provider]  Cranberry-Vitamin C-Vitamin E 140-100-3 MG-MG-UNIT CAPS Take 2 by mouth daily     [provider]  Cyanocobalamin (VITAMIN B-12) 1000 MCG SUBL Take one by mouth daily    [provider]  desonide (DESOWEN) 0.05 % cream Two (2) times a day. Apply to affected area. 12/17/14   [provider]  drospirenone-ethinyl estradiol (NIKKI) 3-0.02 MG tablet TAKE 1 TABLET BY MOUTH EVERY DAY 09/11/16   [provider]  ferrous sulfate 325 (65 FE) MG tablet Take 325 mg by mouth 2 (two) times daily with a meal.    [provider]  fexofenadine (ALLEGRA) 180 MG tablet Take 180 mg by mouth daily.    [provider]  fluticasone (FLONASE SENSIMIST) 27.5 MCG/SPRAY nasal spray Place 1 spray into the nose daily.    [provider]  gabapentin (NEURONTIN) 100 MG capsule TK ONE C PO D 02/25/17   [provider]  HYDROcodone-acetaminophen (NORCO/VICODIN) 5-325 MG tablet Take 1 tablet by mouth every 4 (four) hours as needed. 06/18/18   Tacy Learn, PA-C  losartan (COZAAR) 25 MG tablet Take 1 tablet by mouth daily. 04/09/17   [provider]  montelukast (SINGULAIR) 10 MG tablet TAKE 1 TABLET BY MOUTH EVERY DAY  02/11/18   Kozlow, Donnamarie Poag, MD  ondansetron (ZOFRAN ODT) 4 MG disintegrating tablet Take 1 tablet (4 mg total) by mouth every 8 (eight) hours as needed for nausea or vomiting. 06/18/18   Tacy Learn, PA-C  ranitidine (ZANTAC) 150 MG tablet Take one tablet twice daily 02/15/18   Kozlow, Donnamarie Poag, MD  vitamin C (ASCORBIC ACID) 500 MG tablet Take 500 mg by mouth daily.    [provider]  Vitamin D, Ergocalciferol, (DRISDOL) 50000 units CAPS capsule TK 1 C PO 3 TIMES A WK 03/18/17   [provider]    Family History Family History  Problem Relation Age of Onset  . Hypertension Mother   . Stroke Mother   . Hyperlipidemia Father   . Hypertension Maternal Grandmother   . Pulmonary fibrosis Maternal Grandmother   . Breast cancer Paternal Grandmother     Social History Social History   Tobacco Use  .  Smoking status: Never Smoker  . Smokeless tobacco: Never Used  Substance Use Topics  . Alcohol use: No  . Drug use: No     Allergies   Moxifloxacin; Aspirin; Chlorthalidone; Sulfamethoxazole-trimethoprim; Zoloft [sertraline]; and Ibuprofen   Review of Systems Review of Systems  Constitutional: Negative for chills and fever.  Respiratory: Negative for shortness of breath.   Cardiovascular: Negative for chest pain.  Gastrointestinal: Positive for abdominal pain, nausea and vomiting. Negative for abdominal distention, constipation and diarrhea.  Genitourinary: Negative for dysuria, frequency and urgency.  Musculoskeletal: Negative for back pain.  Skin: Negative for rash and wound.  Allergic/Immunologic: Negative for immunocompromised state.  Neurological: Negative for weakness.  Hematological: Negative for adenopathy. Does not bruise/bleed easily.  Psychiatric/Behavioral: Negative for confusion.  All other systems reviewed and are negative.    Physical Exam Updated Vital Signs BP (!) 148/97   Pulse 96   Temp 98.4 F (36.9 C) (Oral)   Resp 18   Ht '4\' 11"'$  (1.499 m)   Wt 80.7 kg   LMP 05/11/2018 (Approximate)   SpO2 100%   BMI 35.95 kg/m   Physical Exam  Constitutional: She is oriented to person, place, and time. She appears well-developed and well-nourished. No distress.  HENT:  Head: Normocephalic and atraumatic.  Cardiovascular: Normal rate, regular rhythm and intact distal pulses.  Pulmonary/Chest: Effort normal and breath sounds normal.  Abdominal: Normal appearance and bowel sounds are normal. There is no tenderness. There is no CVA tenderness and negative Shimika Ames's sign.  Neurological: She is alert and oriented to person, place, and time.  Skin: Skin is warm and dry. She is not diaphoretic.  Psychiatric: She has a normal mood and affect. Her behavior is normal.  Nursing note and vitals reviewed.    ED Treatments / Results  Labs (all labs ordered are listed,  but only abnormal results are displayed) Labs Reviewed  COMPREHENSIVE METABOLIC PANEL - Abnormal; Notable for the following components:      Result Value   AST 70 (*)    ALT 53 (*)    All other components within normal limits  CBC - Abnormal; Notable for the following components:   WBC 13.6 (*)    All other components within normal limits  URINALYSIS, ROUTINE W REFLEX MICROSCOPIC - Abnormal; Notable for the following components:   APPearance HAZY (*)    All other components within normal limits  LIPASE, BLOOD  I-STAT BETA HCG BLOOD, ED ([facility], [facility], [facility] ONLY)  I-STAT TROPONIN, ED    EKG None  Radiology US  Abdomen Limited  Result Date: 06/18/2018 CLINICAL DATA:  Right upper quadrant abdominal pain EXAM: ULTRASOUND ABDOMEN LIMITED RIGHT UPPER QUADRANT COMPARISON:  None. FINDINGS: Gallbladder: Multiple layering shadowing subcentimeter gallstones in the gallbladder. No gallbladder wall thickening. No significant gallbladder distention. No pericholecystic fluid. No sonographic Lyrah Bradt sign. Common bile duct: Diameter: 8 mm Liver: Liver parenchyma is diffusely echogenic with posterior acoustic attenuation, most compatible with diffuse hepatic steatosis. No definite liver surface irregularity. No liver masses demonstrated, noting decreased sensitivity in the setting of an echogenic liver. Portal vein is patent on color Doppler imaging with normal direction of blood flow towards the liver. IMPRESSION: 1. Cholelithiasis.  No evidence of acute cholecystitis. 2. Dilated common bile duct (8 mm diameter). Obstructing choledocholithiasis cannot be excluded. Recommend correlation with serum bilirubin levels. MRI abdomen with MRCP may be considered for further evaluation, as clinically warranted. 3. Moderate diffuse hepatic steatosis. Electronically Signed   By: Ilona Sorrel M.D.   On: 06/18/2018 17:16    Procedures Procedures (including critical care time)  Medications Ordered in ED Medications - No data to  display   Initial Impression / Assessment and Plan / ED Course  I have reviewed the triage vital signs and the nursing notes.  Pertinent labs & imaging results that were available during my care of the patient were reviewed by me and considered in my medical decision making (see chart for details).  Clinical Course as of Jun 18 1748  Sat Jun 18, 7042  4732 42 year old female presents with complaint of upper abdominal pain with nausea and vomiting.  Patient had a similar episode about 2 weeks ago, was seen by PCP after that episode and advised to have an ultrasound of her gallbladder.  Patient states that she had been pain-free since that time and did not have her ultrasound done.  Patient states that today she ate a hamburger with a baked potato with sour cream, cheese, butter and about 30 minutes later developed a sudden onset of severe right upper quadrant and left upper abdominal pain with nausea and vomiting.  Patient had a prolonged wait in the emergency room and by the time she made it to a room her pain had resolved.  Patient wished to stay for the lab work and ultrasound as recommended, blood work shows mildly elevated liver enzymes, normal bilirubin, normal alk phos, slightly increased WBC at 13.6.    [LM]  1761 CT findings as follows: 1. Cholelithiasis. No evidence of acute cholecystitis. 2. Dilated common bile duct (8 mm diameter). Obstructing choledocholithiasis cannot be excluded. Recommend correlation with serum bilirubin levels. MRI abdomen with MRCP may be considered for further evaluation, as clinically warranted. 3. Moderate diffuse hepatic steatosis.  Repeat abdominal exam, abdomen remains soft and nontender, patient remains pain-free.  Advised patient to return to the emergency room for return of pain, fever, vomiting or any other concerning symptoms.  Given referral for general surgery follow-up and discuss dietary modifications.  Patient and significant other verbalized  understanding of discharge instructions and plan.   [LM]    Clinical Course User Index [LM] Tacy Learn, PA-C    Final Clinical Impressions(s) / ED Diagnoses   Final diagnoses:  Calculus of gallbladder without cholecystitis without obstruction    ED Discharge Orders         Ordered    HYDROcodone-acetaminophen (NORCO/VICODIN) 5-325 MG tablet  Every 4 hours PRN     06/18/18 1743    ondansetron (ZOFRAN ODT) 4 MG disintegrating tablet  Every  8 hours PRN     06/18/18 1743           Tacy Learn, PA-C 06/18/18 1749    Little, Wenda Overland, MD 06/19/18 1536

## 2018-06-18 NOTE — ED Notes (Signed)
I attempted to collect labs and was unsuccessful. 

## 2018-06-19 ENCOUNTER — Encounter (HOSPITAL_COMMUNITY): Payer: Self-pay | Admitting: Emergency Medicine

## 2018-06-19 ENCOUNTER — Inpatient Hospital Stay (HOSPITAL_COMMUNITY)
Admission: EM | Admit: 2018-06-19 | Discharge: 2018-06-20 | DRG: 419 | Disposition: A | Discharge status: Home / Self Care | Payer: BLUE CROSS/BLUE SHIELD | Attending: General Surgery | Admitting: General Surgery

## 2018-06-19 DIAGNOSIS — Z7951 Long term (current) use of inhaled steroids: Secondary | ICD-10-CM

## 2018-06-19 DIAGNOSIS — Z882 Allergy status to sulfonamides status: Secondary | ICD-10-CM

## 2018-06-19 DIAGNOSIS — K8010 Calculus of gallbladder with chronic cholecystitis without obstruction: Principal | ICD-10-CM | POA: Diagnosis present

## 2018-06-19 DIAGNOSIS — Z79899 Other long term (current) drug therapy: Secondary | ICD-10-CM | POA: Diagnosis not present

## 2018-06-19 DIAGNOSIS — Z881 Allergy status to other antibiotic agents status: Secondary | ICD-10-CM | POA: Diagnosis not present

## 2018-06-19 DIAGNOSIS — Z888 Allergy status to other drugs, medicaments and biological substances status: Secondary | ICD-10-CM | POA: Diagnosis not present

## 2018-06-19 DIAGNOSIS — Z419 Encounter for procedure for purposes other than remedying health state, unspecified: Secondary | ICD-10-CM

## 2018-06-19 DIAGNOSIS — F419 Anxiety disorder, unspecified: Secondary | ICD-10-CM | POA: Diagnosis present

## 2018-06-19 DIAGNOSIS — R1011 Right upper quadrant pain: Secondary | ICD-10-CM | POA: Diagnosis present

## 2018-06-19 DIAGNOSIS — Z886 Allergy status to analgesic agent status: Secondary | ICD-10-CM | POA: Diagnosis not present

## 2018-06-19 HISTORY — DX: Cholecystitis, unspecified: K81.9

## 2018-06-19 LAB — URINALYSIS, ROUTINE W REFLEX MICROSCOPIC
Bilirubin Urine: NEGATIVE
GLUCOSE, UA: NEGATIVE mg/dL
Hgb urine dipstick: NEGATIVE
Ketones, ur: NEGATIVE mg/dL
LEUKOCYTES UA: NEGATIVE
Nitrite: NEGATIVE
PROTEIN: NEGATIVE mg/dL
SPECIFIC GRAVITY, URINE: 1.002 — AB (ref 1.005–1.030)
pH: 7 (ref 5.0–8.0)

## 2018-06-19 LAB — CBC WITH DIFFERENTIAL/PLATELET
BASOS PCT: 0 %
Basophils Absolute: 0 10*3/uL (ref 0.0–0.1)
EOS ABS: 0 10*3/uL (ref 0.0–0.7)
EOS PCT: 0 %
HCT: 41.5 % (ref 36.0–46.0)
Hemoglobin: 13.9 g/dL (ref 12.0–15.0)
Lymphocytes Relative: 15 %
Lymphs Abs: 1.3 10*3/uL (ref 0.7–4.0)
MCH: 31 pg (ref 26.0–34.0)
MCHC: 33.5 g/dL (ref 30.0–36.0)
MCV: 92.4 fL (ref 78.0–100.0)
MONO ABS: 0.3 10*3/uL (ref 0.1–1.0)
Monocytes Relative: 3 %
Neutro Abs: 7.5 10*3/uL (ref 1.7–7.7)
Neutrophils Relative %: 82 %
PLATELETS: 328 10*3/uL (ref 150–400)
RBC: 4.49 MIL/uL (ref 3.87–5.11)
RDW: 12.9 % (ref 11.5–15.5)
WBC: 9.2 10*3/uL (ref 4.0–10.5)

## 2018-06-19 LAB — COMPREHENSIVE METABOLIC PANEL
ALK PHOS: 184 U/L — AB (ref 38–126)
ALT: 158 U/L — AB (ref 0–44)
AST: 156 U/L — AB (ref 15–41)
Albumin: 4 g/dL (ref 3.5–5.0)
Anion gap: 12 (ref 5–15)
BUN: 10 mg/dL (ref 6–20)
CALCIUM: 9.7 mg/dL (ref 8.9–10.3)
CHLORIDE: 104 mmol/L (ref 98–111)
CO2: 24 mmol/L (ref 22–32)
Creatinine, Ser: 0.88 mg/dL (ref 0.44–1.00)
GFR calc Af Amer: >60 mL/min (ref >60)
GFR calc non Af Amer: >60 mL/min (ref >60)
GLUCOSE: 100 mg/dL — AB (ref 70–99)
Potassium: 3.8 mmol/L (ref 3.5–5.1)
SODIUM: 140 mmol/L (ref 135–145)
Total Bilirubin: 2.1 mg/dL — ABNORMAL HIGH (ref 0.3–1.2)
Total Protein: 7.6 g/dL (ref 6.5–8.1)

## 2018-06-19 LAB — SURGICAL PCR SCREEN
MRSA, PCR: NEGATIVE
STAPHYLOCOCCUS AUREUS: NEGATIVE

## 2018-06-19 LAB — LIPASE, BLOOD: Lipase: 31 U/L (ref 11–51)

## 2018-06-19 MED ORDER — DIPHENHYDRAMINE HCL 50 MG/ML IJ SOLN: 25.0000 mg | Freq: Four times a day (QID) | INTRAMUSCULAR | Status: DC | PRN

## 2018-06-19 MED ORDER — OXYCODONE HCL 5 MG PO TABS: 5.0000 mg | ORAL_TABLET | ORAL | Status: DC | PRN

## 2018-06-19 MED ORDER — DIPHENHYDRAMINE HCL 25 MG PO CAPS: 25.0000 mg | ORAL_CAPSULE | Freq: Four times a day (QID) | ORAL | Status: DC | PRN

## 2018-06-19 MED ORDER — ENOXAPARIN SODIUM 40 MG/0.4ML ~~LOC~~ SOLN
40.0000 mg | SUBCUTANEOUS | Status: DC
  Filled 2018-06-19: qty 0.4

## 2018-06-19 MED ORDER — ONDANSETRON 4 MG PO TBDP: 4.0000 mg | ORAL_TABLET | Freq: Four times a day (QID) | ORAL | Status: DC | PRN

## 2018-06-19 MED ORDER — BUPROPION HCL ER (XL) 150 MG PO TB24
150.0000 mg | ORAL_TABLET | Freq: Every day | ORAL | Status: DC
  Administered 2018-06-19: 150 mg via ORAL
  Filled 2018-06-19: qty 1

## 2018-06-19 MED ORDER — LACTATED RINGERS IV SOLN: INTRAVENOUS | Status: DC

## 2018-06-19 MED ORDER — LACTATED RINGERS IV SOLN
INTRAVENOUS | Status: DC
  Administered 2018-06-19: 13:00:00 via INTRAVENOUS

## 2018-06-19 MED ORDER — LORATADINE 10 MG PO TABS
10.0000 mg | ORAL_TABLET | Freq: Every day | ORAL | Status: DC
  Administered 2018-06-19: 10 mg via ORAL
  Filled 2018-06-19: qty 1

## 2018-06-19 MED ORDER — ONDANSETRON HCL 4 MG/2ML IJ SOLN
4.0000 mg | Freq: Four times a day (QID) | INTRAMUSCULAR | Status: DC | PRN
  Administered 2018-06-20 (×3): 4 mg via INTRAVENOUS
  Filled 2018-06-19 (×2): qty 2

## 2018-06-19 MED ORDER — FAMOTIDINE 20 MG PO TABS
10.0000 mg | ORAL_TABLET | Freq: Every day | ORAL | Status: DC
  Administered 2018-06-19: 10 mg via ORAL
  Filled 2018-06-19: qty 1

## 2018-06-19 MED ORDER — MORPHINE SULFATE (PF) 2 MG/ML IV SOLN: 2.0000 mg | INTRAVENOUS | Status: DC | PRN

## 2018-06-19 MED ORDER — METOPROLOL TARTRATE 5 MG/5ML IV SOLN: 5.0000 mg | Freq: Four times a day (QID) | INTRAVENOUS | Status: DC | PRN

## 2018-06-19 MED ORDER — ACETAMINOPHEN 650 MG RE SUPP: 650.0000 mg | Freq: Four times a day (QID) | RECTAL | Status: DC | PRN

## 2018-06-19 MED ORDER — KCL IN DEXTROSE-NACL 20-5-0.45 MEQ/L-%-% IV SOLN
INTRAVENOUS | Status: DC
  Administered 2018-06-19 – 2018-06-20 (×3): via INTRAVENOUS
  Filled 2018-06-19 (×2): qty 1000

## 2018-06-19 MED ORDER — ALBUTEROL SULFATE (2.5 MG/3ML) 0.083% IN NEBU: 3.0000 mL | INHALATION_SOLUTION | Freq: Four times a day (QID) | RESPIRATORY_TRACT | Status: DC | PRN

## 2018-06-19 MED ORDER — SODIUM CHLORIDE 0.9 % IV SOLN
2.0000 g | INTRAVENOUS | Status: DC
  Administered 2018-06-19: 2 g via INTRAVENOUS
  Filled 2018-06-19: qty 20
  Filled 2018-06-19: qty 2

## 2018-06-19 MED ORDER — LORAZEPAM 2 MG/ML IJ SOLN
1.0000 mg | Freq: Once | INTRAMUSCULAR | Status: AC
  Administered 2018-06-19: 2 mg via INTRAVENOUS
  Filled 2018-06-19: qty 1

## 2018-06-19 MED ORDER — ACETAMINOPHEN 325 MG PO TABS
650.0000 mg | ORAL_TABLET | Freq: Four times a day (QID) | ORAL | Status: DC | PRN
  Administered 2018-06-19: 650 mg via ORAL
  Filled 2018-06-19: qty 2

## 2018-06-19 NOTE — ED Triage Notes (Signed)
Pt states she feels dehydrated. Pt states pain is 1/10 and seen yesterday, but states she slept all day yesterday without taking po. Pt able to eat and drink today. Pt states urine was dark this morning.

## 2018-06-19 NOTE — ED Notes (Signed)
ED TO INPATIENT HANDOFF REPORT  Name/Age/Gender Amber Parks 42 y.o. female  Code Status   Home/SNF/Other Home  Chief Complaint emesis/urine discoloration  Level of Care/Admitting Diagnosis ED Disposition    ED Disposition Condition [facility] Area: [place] [100102]  Level of Care: Med-Surg [16]  Diagnosis: Chronic calculous cholecystitis [6644034]  Admitting Physician: CCS, Garrett  Attending Physician: CCS, MD [[id number]]  Estimated length of stay: past midnight tomorrow  Certification:: I certify this patient will need inpatient services for at least 2 midnights  PT Class (Do Not Modify): Inpatient [101]  PT Acc Code (Do Not Modify): Private [1]       Medical History Past Medical History:  Diagnosis Date  . Anemia   . Anxiety   . Gall bladder inflammation   . Infection    UTI  . Vaginal Pap smear, abnormal    f/u ok    Allergies Allergies  Allergen Reactions  . Moxifloxacin Anxiety  . Aspirin Nausea And Vomiting  . Chlorthalidone Other (See Comments)    Increased heart rate  . Pineapple Swelling  . Sulfamethoxazole-Trimethoprim Nausea And Vomiting  . Zoloft [Sertraline] Other (See Comments)    Took 1 pill and caused several different problems, HIGHLY allergic! Gives MS symptoms  . Ibuprofen Nausea Only    IV Location/Drains/Wounds Patient Lines/Drains/Airways Status   Active Line/Drains/Airways    Name:   Placement date:   Placement time:   Site:   Days:   Peripheral IV 06/19/18 Right;Posterior;Proximal Forearm   06/19/18    1222    Forearm   less than 1          Labs/Imaging Results for orders placed or performed during the hospital encounter of 06/19/18 (from the past 48 hour(s))  Urinalysis, Routine w reflex microscopic     Status: Abnormal   Collection Time: 06/19/18  9:44 AM  Result Value Ref Range   Color, Urine YELLOW YELLOW   APPearance CLEAR CLEAR   Specific Gravity, Urine 1.002 (L) 1.005 -  1.030   pH 7.0 5.0 - 8.0   Glucose, UA NEGATIVE NEGATIVE mg/dL   Hgb urine dipstick NEGATIVE NEGATIVE   Bilirubin Urine NEGATIVE NEGATIVE   Ketones, ur NEGATIVE NEGATIVE mg/dL   Protein, ur NEGATIVE NEGATIVE mg/dL   Nitrite NEGATIVE NEGATIVE   Leukocytes, UA NEGATIVE NEGATIVE    Comment: Performed at [facility], [place] [address], [place], [place] [address]  Comprehensive metabolic panel     Status: Abnormal   Collection Time: 06/19/18 10:34 AM  Result Value Ref Range   Sodium 140 135 - 145 mmol/L   Potassium 3.8 3.5 - 5.1 mmol/L   Chloride 104 98 - 111 mmol/L   CO2 24 22 - 32 mmol/L   Glucose, Bld 100 (H) 70 - 99 mg/dL   BUN 10 6 - 20 mg/dL   Creatinine, Ser 0.88 0.44 - 1.00 mg/dL   Calcium 9.7 8.9 - 10.3 mg/dL   Total Protein 7.6 6.5 - 8.1 g/dL   Albumin 4.0 3.5 - 5.0 g/dL   AST 156 (H) 15 - 41 U/L   ALT 158 (H) 0 - 44 U/L   Alkaline Phosphatase 184 (H) 38 - 126 U/L   Total Bilirubin 2.1 (H) 0.3 - 1.2 mg/dL   GFR calc non Af Amer >60 >60 mL/min   GFR calc Af Amer >60 >60 mL/min    Comment: (NOTE) The eGFR has been calculated using the CKD EPI  equation. This calculation has not been validated in all clinical situations. eGFR's persistently <60 mL/min signify possible Chronic Kidney Disease.    Anion gap 12 5 - 15    Comment: Performed at [facility], [place] [address], [place], [place] [address]  Lipase, blood     Status: None   Collection Time: 06/19/18 10:34 AM  Result Value Ref Range   Lipase 31 11 - 51 U/L    Comment: Performed at [facility], [place] [address] [place], [place] [address]  CBC with Differential     Status: None   Collection Time: 06/19/18 10:34 AM  Result Value Ref Range   WBC 9.2 4.0 - 10.5 K/uL   RBC 4.49 3.87 - 5.11 MIL/uL   Hemoglobin 13.9 12.0 - 15.0 g/dL   HCT 41.5 36.0 - 46.0 %   MCV 92.4 78.0 - 100.0 fL   MCH 31.0 26.0 - 34.0 pg   MCHC 33.5 30.0 - 36.0 g/dL   RDW 12.9 11.5 - 15.5 %    Platelets 328 150 - 400 K/uL   Neutrophils Relative % 82 %   Neutro Abs 7.5 1.7 - 7.7 K/uL   Lymphocytes Relative 15 %   Lymphs Abs 1.3 0.7 - 4.0 K/uL   Monocytes Relative 3 %   Monocytes Absolute 0.3 0.1 - 1.0 K/uL   Eosinophils Relative 0 %   Eosinophils Absolute 0.0 0.0 - 0.7 K/uL   Basophils Relative 0 %   Basophils Absolute 0.0 0.0 - 0.1 K/uL    Comment: Performed at [facility], [place] [address] [place], [place] [address]   US Abdomen Limited  Result Date: 06/18/2018 CLINICAL DATA:  Right upper quadrant abdominal pain EXAM: ULTRASOUND ABDOMEN LIMITED RIGHT UPPER QUADRANT COMPARISON:  None. FINDINGS: Gallbladder: Multiple layering shadowing subcentimeter gallstones in the gallbladder. No gallbladder wall thickening. No significant gallbladder distention. No pericholecystic fluid. No sonographic Murphy sign. Common bile duct: Diameter: 8 mm Liver: Liver parenchyma is diffusely echogenic with posterior acoustic attenuation, most compatible with diffuse hepatic steatosis. No definite liver surface irregularity. No liver masses demonstrated, noting decreased sensitivity in the setting of an echogenic liver. Portal vein is patent on color Doppler imaging with normal direction of blood flow towards the liver. IMPRESSION: 1. Cholelithiasis.  No evidence of acute cholecystitis. 2. Dilated common bile duct (8 mm diameter). Obstructing choledocholithiasis cannot be excluded. Recommend correlation with serum bilirubin levels. MRI abdomen with MRCP may be considered for further evaluation, as clinically warranted. 3. Moderate diffuse hepatic steatosis. Electronically Signed   By: Ilona Sorrel M.D.   On: 06/18/2018 17:16    Pending Labs [employer] (From admission, onward)    Start     Ordered   Signed and Held  CBC  (enoxaparin (LOVENOX)    CrCl >/= 30 ml/min)  Once,   R    Comments:  Baseline for enoxaparin therapy IF NOT ALREADY DRAWN.  Notify MD if PLT < 100 K.    Signed and  Held   Signed and Held  Creatinine, serum  (enoxaparin (LOVENOX)    CrCl >/= 30 ml/min)  Once,   R    Comments:  Baseline for enoxaparin therapy IF NOT ALREADY DRAWN.    Signed and Held   Signed and Held  Creatinine, serum  (enoxaparin (LOVENOX)    CrCl >/= 30 ml/min)  Weekly,   R    Comments:  while on enoxaparin therapy    Signed and Held  Signed and Held  HIV antibody (Routine Testing)  Once,   R     Signed and Held   Signed and Held  Comprehensive metabolic panel  Daily,   R     Signed and Held   Signed and Held  CBC  Daily,   R     Signed and Held          Vitals/Pain Today's Vitals   06/19/18 7681 06/19/18 1332 06/19/18 1538 06/19/18 1744  BP:  (!) 141/86 (!) 137/91 115/63  Pulse:  93 98 (!) 103  Resp:  _0 Temp:      SpO2:  97% 96% 96%  PainSc: 1        Isolation Precautions No active isolations  Medications Medications  LORazepam (ATIVAN) injection 1 mg (2 mg Intravenous Given 06/19/18 1335)    Mobility walks

## 2018-06-19 NOTE — H&P (Signed)
Amber Parks is an 42 y.o. female.   Chief Complaint: abdominal pain HPI: 42 yo female with 5 week history of recurrent abdominal pain. She has been referred for [place] as outpatient for concern of gallbladder disease but worsened recently prior to completion of outpatient work up. Yesterday the pain got very intense and lasted longer than usual. She came to the ER and felt better by the time the wait was over and was sent home. Today she became very nauseated and was concerned that her urine was very dark. Currently she is not having much abdominal pain.  Past Medical History:  Diagnosis Date  . Anemia   . Anxiety   . Gall bladder inflammation   . Infection    UTI  . Vaginal Pap smear, abnormal    f/u ok    Past Surgical History:  Procedure Laterality Date  . TYMPANOSTOMY TUBE PLACEMENT      Family History  Problem Relation Age of Onset  . Hypertension Mother   . Stroke Mother   . Hyperlipidemia Father   . Hypertension Maternal Grandmother   . Pulmonary fibrosis Maternal Grandmother   . Breast cancer Paternal Grandmother    Social History:  reports that she has never smoked. She has never used smokeless tobacco. She reports that she does not drink alcohol or use drugs.  Allergies:  Allergies  Allergen Reactions  . Moxifloxacin Anxiety  . Aspirin Nausea And Vomiting  . Chlorthalidone Other (See Comments)    Increased heart rate  . Pineapple Swelling  . Sulfamethoxazole-Trimethoprim Nausea And Vomiting  . Zoloft [Sertraline] Other (See Comments)    Took 1 pill and caused several different problems, HIGHLY allergic! Gives MS symptoms  . Ibuprofen Nausea Only     (Not in a hospital admission)  Results for orders placed or performed during the hospital encounter of 06/19/18 (from the past 48 hour(s))  Urinalysis, Routine w reflex microscopic     Status: Abnormal   Collection Time: 06/19/18  9:44 AM  Result Value Ref Range   Color, Urine YELLOW YELLOW   APPearance  CLEAR CLEAR   Specific Gravity, Urine 1.002 (L) 1.005 - 1.030   pH 7.0 5.0 - 8.0   Glucose, UA NEGATIVE NEGATIVE mg/dL   Hgb urine dipstick NEGATIVE NEGATIVE   Bilirubin Urine NEGATIVE NEGATIVE   Ketones, ur NEGATIVE NEGATIVE mg/dL   Protein, ur NEGATIVE NEGATIVE mg/dL   Nitrite NEGATIVE NEGATIVE   Leukocytes, UA NEGATIVE NEGATIVE    Comment: Performed at [facility], [place] [address], [place], [place] [address]  Comprehensive metabolic panel     Status: Abnormal   Collection Time: 06/19/18 10:34 AM  Result Value Ref Range   Sodium 140 135 - 145 mmol/L   Potassium 3.8 3.5 - 5.1 mmol/L   Chloride 104 98 - 111 mmol/L   CO2 24 22 - 32 mmol/L   Glucose, Bld 100 (H) 70 - 99 mg/dL   BUN 10 6 - 20 mg/dL   Creatinine, Ser 0.88 0.44 - 1.00 mg/dL   Calcium 9.7 8.9 - 10.3 mg/dL   Total Protein 7.6 6.5 - 8.1 g/dL   Albumin 4.0 3.5 - 5.0 g/dL   AST 156 (H) 15 - 41 U/L   ALT 158 (H) 0 - 44 U/L   Alkaline Phosphatase 184 (H) 38 - 126 U/L   Total Bilirubin 2.1 (H) 0.3 - 1.2 mg/dL   GFR calc non Af Amer >60 >60 mL/min   GFR calc Af Amer >  60 >60 mL/min    Comment: (NOTE) The eGFR has been calculated using the CKD EPI equation. This calculation has not been validated in all clinical situations. eGFR's persistently <60 mL/min signify possible Chronic Kidney Disease.    Anion gap 12 5 - 15    Comment: Performed at [facility], [place] [address] [place], [place] [address]  Lipase, blood     Status: None   Collection Time: 06/19/18 10:34 AM  Result Value Ref Range   Lipase 31 11 - 51 U/L    Comment: Performed at [facility], [place] [address], [place], [place] [address]  CBC with Differential     Status: None   Collection Time: 06/19/18 10:34 AM  Result Value Ref Range   WBC 9.2 4.0 - 10.5 K/uL   RBC 4.49 3.87 - 5.11 MIL/uL   Hemoglobin 13.9 12.0 - 15.0 g/dL   HCT 41.5 36.0 - 46.0 %   MCV 92.4 78.0 - 100.0 fL   MCH 31.0 26.0 - 34.0 pg    MCHC 33.5 30.0 - 36.0 g/dL   RDW 12.9 11.5 - 15.5 %   Platelets 328 150 - 400 K/uL   Neutrophils Relative % 82 %   Neutro Abs 7.5 1.7 - 7.7 K/uL   Lymphocytes Relative 15 %   Lymphs Abs 1.3 0.7 - 4.0 K/uL   Monocytes Relative 3 %   Monocytes Absolute 0.3 0.1 - 1.0 K/uL   Eosinophils Relative 0 %   Eosinophils Absolute 0.0 0.0 - 0.7 K/uL   Basophils Relative 0 %   Basophils Absolute 0.0 0.0 - 0.1 K/uL    Comment: Performed at [facility], [place] [address] [place], [place] [address]   US Abdomen Limited  Result Date: 06/18/2018 CLINICAL DATA:  Right upper quadrant abdominal pain EXAM: ULTRASOUND ABDOMEN LIMITED RIGHT UPPER QUADRANT COMPARISON:  None. FINDINGS: Gallbladder: Multiple layering shadowing subcentimeter gallstones in the gallbladder. No gallbladder wall thickening. No significant gallbladder distention. No pericholecystic fluid. No sonographic Murphy sign. Common bile duct: Diameter: 8 mm Liver: Liver parenchyma is diffusely echogenic with posterior acoustic attenuation, most compatible with diffuse hepatic steatosis. No definite liver surface irregularity. No liver masses demonstrated, noting decreased sensitivity in the setting of an echogenic liver. Portal vein is patent on color Doppler imaging with normal direction of blood flow towards the liver. IMPRESSION: 1. Cholelithiasis.  No evidence of acute cholecystitis. 2. Dilated common bile duct (8 mm diameter). Obstructing choledocholithiasis cannot be excluded. Recommend correlation with serum bilirubin levels. MRI abdomen with MRCP may be considered for further evaluation, as clinically warranted. 3. Moderate diffuse hepatic steatosis. Electronically Signed   By: Ilona Sorrel M.D.   On: 06/18/2018 17:16    Review of Systems  Constitutional: Negative for chills and fever.  HENT: Negative for hearing loss.   Eyes: Negative for blurred vision and double vision.  Respiratory: Negative for cough and hemoptysis.    Cardiovascular: Negative for chest pain and palpitations.  Gastrointestinal: Positive for abdominal pain, nausea and vomiting.  Genitourinary: Negative for dysuria and urgency.  Musculoskeletal: Negative for myalgias and neck pain.  Skin: Negative for itching and rash.  Neurological: Negative for dizziness, tingling and headaches.  Endo/Heme/Allergies: Does not bruise/bleed easily.  Psychiatric/Behavioral: Negative for depression and suicidal ideas. The patient is nervous/anxious.     Blood pressure (!) 141/86, pulse 93, temperature 98.4 F (36.9 C), resp. rate 16, last menstrual period 06/12/2018, SpO2 97 %. Physical Exam  Vitals reviewed.  Constitutional: She is oriented to person, place, and time. She appears well-developed and well-nourished.  HENT:  Head: Normocephalic and atraumatic.  Eyes: Pupils are equal, round, and reactive to light. Conjunctivae and EOM are normal.  Neck: Normal range of motion. Neck supple.  Cardiovascular: Normal rate and regular rhythm.  Respiratory: Effort normal and breath sounds normal.  GI: Soft. Bowel sounds are normal. She exhibits no distension. There is tenderness in the right upper quadrant.  Musculoskeletal: Normal range of motion.  Neurological: She is alert and oriented to person, place, and time.  Skin: Skin is warm and dry.  Psychiatric: She has a normal mood and affect. Her behavior is normal.     Assessment/Plan 42 yo female with evidence of cholecystitis and dilated CBD and bilirubin of 2.1 concerning for choledocholithiasis -admit to surgery -IV abx -pain control -liquids today -recheck labs in morning, if same or improved plan for lap chole w ioc, if bilirubin significantly rises may consult GI -We discussed the etiology of her pain, we discussed treatment options and recommended surgery. We discussed details of surgery including general anesthesia, laparoscopic approach, identification of cystic duct and common bile duct. Ligation  of cystic duct and cystic artery. Possible need for intraoperative cholangiogram or open procedure. Possible risks of common bile duct injury, liver injury, cystic duct leak, bleeding, infection, post-cholecystectomy syndrome. The patient showed good understanding and all questions were answered   Mickeal Skinner, MD 06/19/2018, 3:29 PM

## 2018-06-19 NOTE — ED Provider Notes (Signed)
[facility]-EMERGENCY DEPT Provider Note   CSN: [id number] Arrival date & time: 06/19/18  7829     History   Chief Complaint Chief Complaint  Patient presents with  . Dehydration    HPI Amber Parks is a 42 y.o. female.  The history is provided by the patient. No language interpreter was used.   Amber Parks is a 42 y.o. female who presents to the Emergency Department complaining of dehydration. She presents to the emergency department for concerns of dehydration. She was evaluated in the emergency department yesterday for generalized upper abdominal pain and vomiting. She was feeling improved at the time of hospital discharge. She did have mild right upper quadrant discomfort that she rates is 1/10. She did have poor appetite and did not drink fluids after hospital discharge. She did have persistent nausea. She was unable to fill her prescription medications. This morning when she awoke she had brown colored urine and she was concerned for dehydration and presents for evaluation. Since her first void that was dark she has had four glasses of water and states that her urine is clearing up nicely. She denies any fevers. She had one recurrent episode of emesis this morning at 8 AM. Overall she feels significantly improved compared to yesterday. She does endorse a history of anxiety and does feel anxious today. Past Medical History:  Diagnosis Date  . Anemia   . Anxiety   . Gall bladder inflammation   . Infection    UTI  . Vaginal Pap smear, abnormal    f/u ok    There are no active problems to display for this patient.   Past Surgical History:  Procedure Laterality Date  . TYMPANOSTOMY TUBE PLACEMENT       OB History    Gravida  0   Para  0   Term  0   Preterm  0   AB  0   Living  0     SAB  0   TAB  0   Ectopic  0   Multiple  0   Live Births               Home Medications    Prior to Admission medications   Medication  Sig Start Date End Date Taking? Authorizing Provider  albuterol (PROAIR HFA) 108 (90 Base) MCG/ACT inhaler Inhale into the lungs. 11/15/15   [provider]  AUVI-Q 0.3 MG/0.3ML SOAJ injection Use as directed for life-threatening allergic reaction. 04/13/17   Kozlow, Alvira Philips, MD  buPROPion (WELLBUTRIN XL) 150 MG 24 hr tablet Take 150 mg by mouth daily.    [provider]  clotrimazole (MYCELEX) 10 MG troche  04/21/17   [provider]  Cranberry-Vitamin C-Vitamin E 140-100-3 MG-MG-UNIT CAPS Take 2 by mouth daily    [provider]  Cyanocobalamin (VITAMIN B-12) 1000 MCG SUBL Take one by mouth daily    [provider]  desonide (DESOWEN) 0.05 % cream Two (2) times a day. Apply to affected area. 12/17/14   [provider]  drospirenone-ethinyl estradiol (NIKKI) 3-0.02 MG tablet TAKE 1 TABLET BY MOUTH EVERY DAY 09/11/16   [provider]  ferrous sulfate 325 (65 FE) MG tablet Take 325 mg by mouth 2 (two) times daily with a meal.    [provider]  fexofenadine (ALLEGRA) 180 MG tablet Take 180 mg by mouth daily.    [provider]  fluticasone (FLONASE SENSIMIST) 27.5 MCG/SPRAY nasal spray Place 1  spray into the nose daily.    [provider]  gabapentin (NEURONTIN) 100 MG capsule TK ONE C PO D 02/25/17   [provider]  HYDROcodone-acetaminophen (NORCO/VICODIN) 5-325 MG tablet Take 1 tablet by mouth every 4 (four) hours as needed. 06/18/18   Jeannie Fend, PA-C  losartan (COZAAR) 25 MG tablet Take 1 tablet by mouth daily. 04/09/17   [provider]  montelukast (SINGULAIR) 10 MG tablet TAKE 1 TABLET BY MOUTH EVERY DAY 02/11/18   Kozlow, Alvira Philips, MD  ondansetron (ZOFRAN ODT) 4 MG disintegrating tablet Take 1 tablet (4 mg total) by mouth every 8 (eight) hours as needed for nausea or vomiting. 06/18/18   Jeannie Fend, PA-C  ranitidine (ZANTAC) 150 MG tablet Take one tablet twice daily 02/15/18   Kozlow, Alvira Philips,  MD  vitamin C (ASCORBIC ACID) 500 MG tablet Take 500 mg by mouth daily.    [provider]  Vitamin D, Ergocalciferol, (DRISDOL) 50000 units CAPS capsule TK 1 C PO 3 TIMES A WK 03/18/17   [provider]    Family History Family History  Problem Relation Age of Onset  . Hypertension Mother   . Stroke Mother   . Hyperlipidemia Father   . Hypertension Maternal Grandmother   . Pulmonary fibrosis Maternal Grandmother   . Breast cancer Paternal Grandmother     Social History Social History   Tobacco Use  . Smoking status: Never Smoker  . Smokeless tobacco: Never Used  Substance Use Topics  . Alcohol use: No  . Drug use: No     Allergies   Moxifloxacin; Aspirin; Chlorthalidone; Sulfamethoxazole-trimethoprim; Zoloft [sertraline]; and Ibuprofen   Review of Systems Review of Systems  All other systems reviewed and are negative.    Physical Exam Updated Vital Signs BP (!) 162/109   Pulse 93   Temp 98.4 F (36.9 C)   Resp 18   LMP 06/12/2018 (Exact Date)   SpO2 98%   Physical Exam  Constitutional: She is oriented to person, place, and time. She appears well-developed and well-nourished.  HENT:  Head: Normocephalic and atraumatic.  Cardiovascular: Normal rate and regular rhythm.  No murmur heard. Pulmonary/Chest: Effort normal and breath sounds normal. No respiratory distress.  Abdominal: Soft. There is no rebound and no guarding.  Very mild right upper quadrant tenderness with negative Murphy's  Musculoskeletal: She exhibits no edema or tenderness.  Neurological: She is alert and oriented to person, place, and time.  Skin: Skin is warm and dry.  Psychiatric: Her behavior is normal.  Anxious appearing  Nursing note and vitals reviewed.    ED Treatments / Results  Labs (all labs ordered are listed, but only abnormal results are displayed) Labs Reviewed  URINALYSIS, ROUTINE W REFLEX MICROSCOPIC - Abnormal; Notable for the following components:        Result Value   Specific Gravity, Urine 1.002 (*)    All other components within normal limits  COMPREHENSIVE METABOLIC PANEL  LIPASE, BLOOD  CBC WITH DIFFERENTIAL/PLATELET    EKG None  Radiology US Abdomen Limited  Result Date: 06/18/2018 CLINICAL DATA:  Right upper quadrant abdominal pain EXAM: ULTRASOUND ABDOMEN LIMITED RIGHT UPPER QUADRANT COMPARISON:  None. FINDINGS: Gallbladder: Multiple layering shadowing subcentimeter gallstones in the gallbladder. No gallbladder wall thickening. No significant gallbladder distention. No pericholecystic fluid. No sonographic Murphy sign. Common bile duct: Diameter: 8 mm Liver: Liver parenchyma is diffusely echogenic with posterior acoustic attenuation, most compatible with diffuse hepatic steatosis. No definite liver surface  irregularity. No liver masses demonstrated, noting decreased sensitivity in the setting of an echogenic liver. Portal vein is patent on color Doppler imaging with normal direction of blood flow towards the liver. IMPRESSION: 1. Cholelithiasis.  No evidence of acute cholecystitis. 2. Dilated common bile duct (8 mm diameter). Obstructing choledocholithiasis cannot be excluded. Recommend correlation with serum bilirubin levels. MRI abdomen with MRCP may be considered for further evaluation, as clinically warranted. 3. Moderate diffuse hepatic steatosis. Electronically Signed   By: Delbert Phenix M.D.   On: 06/18/2018 17:16    Procedures Procedures (including critical care time)  Medications Ordered in ED Medications  lactated ringers infusion (has no administration in time range)     Initial Impression / Assessment and Plan / ED Course  I have reviewed the triage vital signs and the nursing notes.  Pertinent labs & imaging results that were available during my care of the patient were reviewed by me and considered in my medical decision making (see chart for details).    patient here for evaluation of mild right upper  quadrant pain, dark urine and vomiting this morning. She is non-toxic appearing on examination. She does have some mild right upper quadrant tenderness with no peritoneal findings. Records reviewed from recent ED visit. Right upper quadrant ultrasound did demonstrate gallstones mild dilation of common bile duct. Repeat LFTs today show rise in liver enzymes compared to yesterday. Concern for possible choledocholithiasis and cholecystitis given her symptoms. General surgery consulted for further treatment. Patient updated findings of studies and recommendation for surgical evaluation and she is in agreement with plan.  Final Clinical Impressions(s) / ED Diagnoses   Final diagnoses:  None    ED Discharge Orders    None       Tilden Fossa, MD 06/19/18 1656

## 2018-06-20 ENCOUNTER — Encounter (HOSPITAL_COMMUNITY): Payer: Self-pay | Admitting: Anesthesiology

## 2018-06-20 ENCOUNTER — Encounter (HOSPITAL_COMMUNITY): Admission: EM | Disposition: A | Discharge status: Home / Self Care | Payer: Self-pay | Source: Home / Self Care

## 2018-06-20 ENCOUNTER — Inpatient Hospital Stay (HOSPITAL_COMMUNITY): Payer: BLUE CROSS/BLUE SHIELD | Admitting: Anesthesiology

## 2018-06-20 ENCOUNTER — Inpatient Hospital Stay (HOSPITAL_COMMUNITY): Payer: BLUE CROSS/BLUE SHIELD

## 2018-06-20 HISTORY — PX: CHOLECYSTECTOMY: SHX55

## 2018-06-20 LAB — CBC
HCT: 41.1 % (ref 36.0–46.0)
Hemoglobin: 13.6 g/dL (ref 12.0–15.0)
MCH: 30.7 pg (ref 26.0–34.0)
MCHC: 33.1 g/dL (ref 30.0–36.0)
MCV: 92.8 fL (ref 78.0–100.0)
PLATELETS: 296 10*3/uL (ref 150–400)
RBC: 4.43 MIL/uL (ref 3.87–5.11)
RDW: 13.1 % (ref 11.5–15.5)
WBC: 7.5 10*3/uL (ref 4.0–10.5)

## 2018-06-20 LAB — COMPREHENSIVE METABOLIC PANEL
ALK PHOS: 162 U/L — AB (ref 38–126)
ALT: 167 U/L — ABNORMAL HIGH (ref 0–44)
AST: 104 U/L — ABNORMAL HIGH (ref 15–41)
Albumin: 3.5 g/dL (ref 3.5–5.0)
Anion gap: 9 (ref 5–15)
BUN: 8 mg/dL (ref 6–20)
CALCIUM: 9.2 mg/dL (ref 8.9–10.3)
CO2: 25 mmol/L (ref 22–32)
CREATININE: 0.75 mg/dL (ref 0.44–1.00)
Chloride: 107 mmol/L (ref 98–111)
Glucose, Bld: 104 mg/dL — ABNORMAL HIGH (ref 70–99)
Potassium: 4 mmol/L (ref 3.5–5.1)
Sodium: 141 mmol/L (ref 135–145)
Total Bilirubin: 0.7 mg/dL (ref 0.3–1.2)
Total Protein: 6.8 g/dL (ref 6.5–8.1)

## 2018-06-20 LAB — HIV ANTIBODY (ROUTINE TESTING W REFLEX): HIV SCREEN 4TH GENERATION: NONREACTIVE

## 2018-06-20 SURGERY — LAPAROSCOPIC CHOLECYSTECTOMY WITH INTRAOPERATIVE CHOLANGIOGRAM
Anesthesia: General | Site: Abdomen

## 2018-06-20 MED ORDER — MIDAZOLAM HCL 2 MG/2ML IJ SOLN
INTRAMUSCULAR | Status: AC
  Filled 2018-06-20: qty 2

## 2018-06-20 MED ORDER — PROPOFOL 10 MG/ML IV BOLUS
INTRAVENOUS | Status: DC | PRN
  Administered 2018-06-20: 170 mg via INTRAVENOUS

## 2018-06-20 MED ORDER — LACTATED RINGERS IV SOLN
INTRAVENOUS | Status: DC | PRN
  Administered 2018-06-20: 10:00:00 via INTRAVENOUS

## 2018-06-20 MED ORDER — PROPOFOL 10 MG/ML IV BOLUS
INTRAVENOUS | Status: AC
  Filled 2018-06-20: qty 20

## 2018-06-20 MED ORDER — KETOROLAC TROMETHAMINE 30 MG/ML IJ SOLN
15.0000 mg | Freq: Three times a day (TID) | INTRAMUSCULAR | Status: DC
  Administered 2018-06-20: 15 mg via INTRAVENOUS
  Filled 2018-06-20: qty 1

## 2018-06-20 MED ORDER — MIDAZOLAM HCL 5 MG/5ML IJ SOLN
INTRAMUSCULAR | Status: DC | PRN
  Administered 2018-06-20: 2 mg via INTRAVENOUS

## 2018-06-20 MED ORDER — DEXAMETHASONE SODIUM PHOSPHATE 10 MG/ML IJ SOLN
INTRAMUSCULAR | Status: DC | PRN
  Administered 2018-06-20: 10 mg via INTRAVENOUS

## 2018-06-20 MED ORDER — LIDOCAINE 2% (20 MG/ML) 5 ML SYRINGE
INTRAMUSCULAR | Status: DC | PRN
  Administered 2018-06-20: 60 mg via INTRAVENOUS

## 2018-06-20 MED ORDER — FENTANYL CITRATE (PF) 100 MCG/2ML IJ SOLN: 25.0000 ug | INTRAMUSCULAR | Status: DC | PRN

## 2018-06-20 MED ORDER — ACETAMINOPHEN 500 MG PO TABS
1000.0000 mg | ORAL_TABLET | Freq: Four times a day (QID) | ORAL | Status: DC
  Administered 2018-06-20: 1000 mg via ORAL
  Filled 2018-06-20: qty 2

## 2018-06-20 MED ORDER — FENTANYL CITRATE (PF) 100 MCG/2ML IJ SOLN
INTRAMUSCULAR | Status: AC
  Filled 2018-06-20: qty 2

## 2018-06-20 MED ORDER — IOPAMIDOL (ISOVUE-300) INJECTION 61%
INTRAVENOUS | Status: AC
  Filled 2018-06-20: qty 50

## 2018-06-20 MED ORDER — SUGAMMADEX SODIUM 200 MG/2ML IV SOLN
INTRAVENOUS | Status: AC
  Filled 2018-06-20: qty 2

## 2018-06-20 MED ORDER — FENTANYL CITRATE (PF) 100 MCG/2ML IJ SOLN
INTRAMUSCULAR | Status: DC | PRN
  Administered 2018-06-20 (×2): 50 ug via INTRAVENOUS
  Administered 2018-06-20: 100 ug via INTRAVENOUS

## 2018-06-20 MED ORDER — LACTATED RINGERS IV SOLN: INTRAVENOUS | Status: DC

## 2018-06-20 MED ORDER — IOPAMIDOL (ISOVUE-300) INJECTION 61%
INTRAVENOUS | Status: DC | PRN
  Administered 2018-06-20: 15 mL

## 2018-06-20 MED ORDER — BUPIVACAINE-EPINEPHRINE (PF) 0.5% -1:200000 IJ SOLN
INTRAMUSCULAR | Status: DC | PRN
  Administered 2018-06-20: 30 mL

## 2018-06-20 MED ORDER — ONDANSETRON HCL 4 MG/2ML IJ SOLN: 4.0000 mg | Freq: Once | INTRAMUSCULAR | Status: DC | PRN

## 2018-06-20 MED ORDER — ROCURONIUM BROMIDE 10 MG/ML (PF) SYRINGE
PREFILLED_SYRINGE | INTRAVENOUS | Status: DC | PRN
  Administered 2018-06-20: 50 mg via INTRAVENOUS
  Administered 2018-06-20: 10 mg via INTRAVENOUS

## 2018-06-20 MED ORDER — MORPHINE SULFATE (PF) 2 MG/ML IV SOLN: 1.0000 mg | INTRAVENOUS | Status: DC | PRN

## 2018-06-20 MED ORDER — 0.9 % SODIUM CHLORIDE (POUR BTL) OPTIME
TOPICAL | Status: DC | PRN
  Administered 2018-06-20: 1000 mL

## 2018-06-20 MED ORDER — LACTATED RINGERS IR SOLN
Status: DC | PRN
  Administered 2018-06-20: 1000 mL

## 2018-06-20 MED ORDER — BUPIVACAINE-EPINEPHRINE (PF) 0.5% -1:200000 IJ SOLN
INTRAMUSCULAR | Status: AC
  Filled 2018-06-20: qty 30

## 2018-06-20 MED ORDER — ALUM & MAG HYDROXIDE-SIMETH 200-200-20 MG/5ML PO SUSP
30.0000 mL | Freq: Four times a day (QID) | ORAL | Status: DC | PRN
  Administered 2018-06-20: 30 mL via ORAL
  Filled 2018-06-20: qty 30

## 2018-06-20 MED ORDER — BUPIVACAINE HCL (PF) 0.25 % IJ SOLN
INTRAMUSCULAR | Status: AC
  Filled 2018-06-20: qty 30

## 2018-06-20 MED ORDER — SUGAMMADEX SODIUM 200 MG/2ML IV SOLN
INTRAVENOUS | Status: DC | PRN
  Administered 2018-06-20: 200 mg via INTRAVENOUS

## 2018-06-20 SURGICAL SUPPLY — 39 items
APPLIER CLIP 5 13 M/L LIGAMAX5 (MISCELLANEOUS) ×3
CABLE HIGH FREQUENCY MONO STRZ (ELECTRODE) ×3 IMPLANT
CHLORAPREP W/TINT 26ML (MISCELLANEOUS) ×3 IMPLANT
CLIP APPLIE 5 13 M/L LIGAMAX5 (MISCELLANEOUS) ×1 IMPLANT
COVER MAYO STAND STRL (DRAPES) ×3 IMPLANT
DERMABOND ADVANCED (GAUZE/BANDAGES/DRESSINGS) ×2
DERMABOND ADVANCED .7 DNX12 (GAUZE/BANDAGES/DRESSINGS) ×1 IMPLANT
DRAPE C-ARM 42X120 X-RAY (DRAPES) ×3 IMPLANT
ELECT REM PT RETURN 15FT ADLT (MISCELLANEOUS) ×3 IMPLANT
GLOVE BIO SURGEON STRL SZ 6.5 (GLOVE) ×2 IMPLANT
GLOVE BIO SURGEONS STRL SZ 6.5 (GLOVE) ×1
GLOVE BIOGEL PI IND STRL 6.5 (GLOVE) ×1 IMPLANT
GLOVE BIOGEL PI IND STRL 7.0 (GLOVE) ×2 IMPLANT
GLOVE BIOGEL PI IND STRL 7.5 (GLOVE) ×1 IMPLANT
GLOVE BIOGEL PI INDICATOR 6.5 (GLOVE) ×2
GLOVE BIOGEL PI INDICATOR 7.0 (GLOVE) ×4
GLOVE BIOGEL PI INDICATOR 7.5 (GLOVE) ×2
GLOVE ECLIPSE 7.0 STRL STRAW (GLOVE) ×6 IMPLANT
GLOVE ECLIPSE 7.5 STRL STRAW (GLOVE) ×3 IMPLANT
GOWN STRL REUS W/TWL 2XL LVL3 (GOWN DISPOSABLE) ×3 IMPLANT
GOWN STRL REUS W/TWL LRG LVL3 (GOWN DISPOSABLE) ×6 IMPLANT
GOWN STRL REUS W/TWL XL LVL3 (GOWN DISPOSABLE) ×6 IMPLANT
IRRIG SUCT STRYKERFLOW 2 WTIP (MISCELLANEOUS) ×3
IRRIGATION SUCT STRKRFLW 2 WTP (MISCELLANEOUS) ×1 IMPLANT
IV CATH 14GX2 1/4 (CATHETERS) ×3 IMPLANT
KIT BASIN OR (CUSTOM PROCEDURE TRAY) ×3 IMPLANT
POUCH SPECIMEN RETRIEVAL 10MM (ENDOMECHANICALS) ×3 IMPLANT
SCISSORS LAP 5X35 DISP (ENDOMECHANICALS) ×3 IMPLANT
SET CHOLANGIOGRAPH MIX (MISCELLANEOUS) ×3 IMPLANT
SLEEVE XCEL OPT CAN 5 100 (ENDOMECHANICALS) ×6 IMPLANT
SUT VIC AB 2-0 SH 27 (SUTURE) ×2
SUT VIC AB 2-0 SH 27X BRD (SUTURE) ×1 IMPLANT
SUT VIC AB 4-0 PS2 18 (SUTURE) ×3 IMPLANT
TOWEL OR 17X26 10 PK STRL BLUE (TOWEL DISPOSABLE) ×3 IMPLANT
TOWEL OR NON WOVEN STRL DISP B (DISPOSABLE) ×3 IMPLANT
TRAY LAPAROSCOPIC (CUSTOM PROCEDURE TRAY) ×3 IMPLANT
TROCAR BLADELESS OPT 5 100 (ENDOMECHANICALS) ×3 IMPLANT
TROCAR XCEL BLUNT TIP 100MML (ENDOMECHANICALS) ×3 IMPLANT
TUBING INSUF HEATED (TUBING) ×3 IMPLANT

## 2018-06-20 NOTE — Anesthesia Procedure Notes (Signed)
Procedure Name: Intubation Date/Time: 06/20/2018 11:48 AM Performed by: Lavina Hamman, CRNA Pre-anesthesia Checklist: Patient identified, Emergency Drugs available, Suction available, Patient being monitored and Timeout performed Patient Re-evaluated:Patient Re-evaluated prior to induction Oxygen Delivery Method: Circle system utilized Preoxygenation: Pre-oxygenation with 100% oxygen Induction Type: IV induction Ventilation: Mask ventilation without difficulty Laryngoscope Size: Mac and 3 Grade View: Grade II Tube type: Oral Tube size: 7.0 mm Number of attempts: 1 Airway Equipment and Method: Stylet Placement Confirmation: ETT inserted through vocal cords under direct vision,  positive ETCO2,  CO2 detector and breath sounds checked- equal and bilateral Secured at: 20 cm Tube secured with: Tape Dental Injury: Teeth and Oropharynx as per pre-operative assessment

## 2018-06-20 NOTE — Discharge Instructions (Addendum)

## 2018-06-20 NOTE — Transfer of Care (Signed)
Immediate Anesthesia Transfer of Care Note  Patient: Amber Parks  Procedure(s) Performed: Procedure(s): LAPAROSCOPIC CHOLECYSTECTOMY WITH INTRAOPERATIVE CHOLANGIOGRAM (N/A)  Patient Location: PACU  Anesthesia Type:General  Level of Consciousness:  sedated, patient cooperative and responds to stimulation  Airway & Oxygen Therapy:Patient Spontanous Breathing and Patient connected to face mask oxgen  Post-op Assessment:  Report given to PACU RN and Post -op Vital signs reviewed and stable  Post vital signs:  Reviewed and stable  Last Vitals:  Vitals:   06/19/18 1920 06/20/18 0458  BP: 137/89 122/88  Pulse: 91 92  Resp: 16 17  Temp: 36.8 C 36.8 C  SpO2: 96% 97%    Complications: No apparent anesthesia complications

## 2018-06-20 NOTE — Progress Notes (Signed)
Cholecystitis  Subjective: Pt anxious about surgery  Objective: Vital signs in last 24 hours: Temp:  [98.2 F (36.8 C)] 98.2 F (36.8 C) (09/09 0458) Pulse Rate:  [91-103] 92 (09/09 0458) Resp:  [16-17] 17 (09/09 0458) BP: (115-141)/(63-91) 122/88 (09/09 0458) SpO2:  [96 %-97 %] 97 % (09/09 0458) Last BM Date: 06/18/18  Intake/Output from previous day: 09/08 0701 - 09/09 0700 In: 940.4 [P.O.:360; I.V.:480.4; IV Piggyback:100] Out: -  Intake/Output this shift: No intake/output data recorded.  General appearance: alert and cooperative GI: TTP RUQ  Lab Results:  Results for orders placed or performed during the hospital encounter of 06/19/18 (from the past 24 hour(s))  Comprehensive metabolic panel     Status: Abnormal   Collection Time: 06/19/18 10:34 AM  Result Value Ref Range   Sodium 140 135 - 145 mmol/L   Potassium 3.8 3.5 - 5.1 mmol/L   Chloride 104 98 - 111 mmol/L   CO2 24 22 - 32 mmol/L   Glucose, Bld 100 (H) 70 - 99 mg/dL   BUN 10 6 - 20 mg/dL   Creatinine, Ser 6.41 0.44 - 1.00 mg/dL   Calcium 9.7 8.9 - 58.3 mg/dL   Total Protein 7.6 6.5 - 8.1 g/dL   Albumin 4.0 3.5 - 5.0 g/dL   AST 094 (H) 15 - 41 U/L   ALT 158 (H) 0 - 44 U/L   Alkaline Phosphatase 184 (H) 38 - 126 U/L   Total Bilirubin 2.1 (H) 0.3 - 1.2 mg/dL   GFR calc non Af Amer >60 >60 mL/min   GFR calc Af Amer >60 >60 mL/min   Anion gap 12 5 - 15  Lipase, blood     Status: None   Collection Time: 06/19/18 10:34 AM  Result Value Ref Range   Lipase 31 11 - 51 U/L  CBC with Differential     Status: None   Collection Time: 06/19/18 10:34 AM  Result Value Ref Range   WBC 9.2 4.0 - 10.5 K/uL   RBC 4.49 3.87 - 5.11 MIL/uL   Hemoglobin 13.9 12.0 - 15.0 g/dL   HCT 07.6 80.8 - 81.1 %   MCV 92.4 78.0 - 100.0 fL   MCH 31.0 26.0 - 34.0 pg   MCHC 33.5 30.0 - 36.0 g/dL   RDW 03.1 59.4 - 58.5 %   Platelets 328 150 - 400 K/uL   Neutrophils Relative % 82 %   Neutro Abs 7.5 1.7 - 7.7 K/uL   Lymphocytes  Relative 15 %   Lymphs Abs 1.3 0.7 - 4.0 K/uL   Monocytes Relative 3 %   Monocytes Absolute 0.3 0.1 - 1.0 K/uL   Eosinophils Relative 0 %   Eosinophils Absolute 0.0 0.0 - 0.7 K/uL   Basophils Relative 0 %   Basophils Absolute 0.0 0.0 - 0.1 K/uL  Surgical pcr screen     Status: None   Collection Time: 06/19/18 10:01 PM  Result Value Ref Range   MRSA, PCR NEGATIVE NEGATIVE   Staphylococcus aureus NEGATIVE NEGATIVE  Comprehensive metabolic panel     Status: Abnormal   Collection Time: 06/20/18  5:40 AM  Result Value Ref Range   Sodium 141 135 - 145 mmol/L   Potassium 4.0 3.5 - 5.1 mmol/L   Chloride 107 98 - 111 mmol/L   CO2 25 22 - 32 mmol/L   Glucose, Bld 104 (H) 70 - 99 mg/dL   BUN 8 6 - 20 mg/dL   Creatinine, Ser 9.29 0.44 - 1.00  mg/dL   Calcium 9.2 8.9 - 16.1 mg/dL   Total Protein 6.8 6.5 - 8.1 g/dL   Albumin 3.5 3.5 - 5.0 g/dL   AST 096 (H) 15 - 41 U/L   ALT 167 (H) 0 - 44 U/L   Alkaline Phosphatase 162 (H) 38 - 126 U/L   Total Bilirubin 0.7 0.3 - 1.2 mg/dL   GFR calc non Af Amer >60 >60 mL/min   GFR calc Af Amer >60 >60 mL/min   Anion gap 9 5 - 15  CBC     Status: None   Collection Time: 06/20/18  5:40 AM  Result Value Ref Range   WBC 7.5 4.0 - 10.5 K/uL   RBC 4.43 3.87 - 5.11 MIL/uL   Hemoglobin 13.6 12.0 - 15.0 g/dL   HCT 04.5 40.9 - 81.1 %   MCV 92.8 78.0 - 100.0 fL   MCH 30.7 26.0 - 34.0 pg   MCHC 33.1 30.0 - 36.0 g/dL   RDW 91.4 78.2 - 95.6 %   Platelets 296 150 - 400 K/uL     Studies/Results Radiology     MEDS, Scheduled . [MAR Hold] buPROPion  150 mg Oral Daily  . [MAR Hold] enoxaparin (LOVENOX) injection  40 mg Subcutaneous Q24H  . [MAR Hold] famotidine  10 mg Oral Daily  . [MAR Hold] loratadine  10 mg Oral Daily     Assessment: Cholecystitis with possible transient biliary obstruction.  Tbili has trended back down.  Will plan on lap chole today.  Plan: The anatomy & physiology of hepatobiliary & pancreatic function was discussed.  The  pathophysiology of gallbladder dysfunction was discussed.  Natural history risks without surgery was discussed.   I feel the risks of no intervention will lead to serious problems that outweigh the operative risks; therefore, I recommended cholecystectomy to remove the pathology.  I explained laparoscopic techniques with possible need for an open approach.  Probable cholangiogram to evaluate the bilary tract was explained as well.    Risks such as bleeding, infection, abscess, leak, injury to other organs, need for further treatment, heart attack, death, and other risks were discussed.  I noted a good likelihood this will help address the problem.  Possibility that this will not correct all abdominal symptoms was explained.  Goals of post-operative recovery were discussed as well.  We will work to minimize complications.  An educational handout further explaining the pathology and treatment options was given as well.  Questions were answered.  The patient expresses understanding & wishes to proceed with surgery.  LOS: 1 day    Vanita Panda, MD [facility], [place] [phone]   06/20/2018 10:16 AM

## 2018-06-20 NOTE — Anesthesia Preprocedure Evaluation (Signed)
Anesthesia Evaluation  Patient identified by MRN, date of birth, ID band Patient awake    Reviewed: Allergy & Precautions, NPO status , Patient's Chart, lab work & pertinent test results  Airway Mallampati: II  TM Distance: >3 FB Neck ROM: Full    Dental  (+) Dental Advisory Given   Pulmonary neg pulmonary ROS,    breath sounds clear to auscultation       Cardiovascular hypertension, Pt. on medications negative cardio ROS   Rhythm:Regular Rate:Normal     Neuro/Psych Anxiety negative neurological ROS     GI/Hepatic negative GI ROS, Neg liver ROS,   Endo/Other  negative endocrine ROS  Renal/GU negative Renal ROS     Musculoskeletal   Abdominal   Peds  Hematology negative hematology ROS (+)   Anesthesia Other Findings   Reproductive/Obstetrics                             Lab Results  Component Value Date   WBC 7.5 06/20/2018   HGB 13.6 06/20/2018   HCT 41.1 06/20/2018   MCV 92.8 06/20/2018   PLT 296 06/20/2018   Lab Results  Component Value Date   CREATININE 0.75 06/20/2018   BUN 8 06/20/2018   NA 141 06/20/2018   K 4.0 06/20/2018   CL 107 06/20/2018   CO2 25 06/20/2018    Anesthesia Physical Anesthesia Plan  ASA: II  Anesthesia Plan: General   Post-op Pain Management:    Induction: Intravenous  PONV Risk Score and Plan: 3 and Dexamethasone, Ondansetron and Treatment may vary due to age or medical condition  Airway Management Planned: Oral ETT  Additional Equipment:   Intra-op Plan:   Post-operative Plan: Extubation in OR  Informed Consent: I have reviewed the patients History and Physical, chart, labs and discussed the procedure including the risks, benefits and alternatives for the proposed anesthesia with the patient or authorized representative who has indicated his/her understanding and acceptance.   Dental advisory given  Plan Discussed with:  CRNA  Anesthesia Plan Comments:         Anesthesia Quick Evaluation

## 2018-06-20 NOTE — Care Management (Signed)
PRE-OPERATIVE DIAGNOSIS:  cholelithiasis  POST-OPERATIVE DIAGNOSIS:  cholelithiasis  PROCEDURE:  LAPAROSCOPIC CHOLECYSTECTOMY WITH INTRAOPERATIVE CHOLANGIOGRAM Day of surgery

## 2018-06-20 NOTE — Progress Notes (Signed)
Amber Parks to be D/C'd Home per MD order.  Discussed prescriptions and follow up appointments with the patient. Prescriptions given to patient, medication list explained in detail. Pt verbalized understanding.  Allergies as of 06/20/2018      Reactions   Moxifloxacin Anxiety   Aspirin Nausea And Vomiting   Chlorthalidone Other (See Comments)   Increased heart rate   Pineapple Swelling   Sulfamethoxazole-trimethoprim Nausea And Vomiting   Zoloft [sertraline] Other (See Comments)   Took 1 pill and caused several different problems, HIGHLY allergic! Gives MS symptoms   Ibuprofen Nausea Only      Medication List    TAKE these medications   acetaminophen 500 MG tablet Commonly known as:  TYLENOL Take 1,000 mg by mouth every 6 (six) hours as needed for mild pain.   AUVI-Q 0.3 mg/0.3 mL Soaj injection Generic drug:  EPINEPHrine Use as directed for life-threatening allergic reaction.   buPROPion 150 MG 24 hr tablet Commonly known as:  WELLBUTRIN XL Take 150 mg by mouth daily.   Cranberry-Vitamin C-Vitamin E 140-100-3 MG-MG-UNIT Caps Take 2 tablets by mouth daily.   fexofenadine 180 MG tablet Commonly known as:  ALLEGRA Take 180 mg by mouth daily as needed for allergies or rhinitis.   FLONASE SENSIMIST 27.5 MCG/SPRAY nasal spray Generic drug:  fluticasone Place 1 spray into the nose daily as needed for rhinitis or allergies.   gabapentin 100 MG capsule Commonly known as:  NEURONTIN Take 100 mg by mouth at bedtime.   HYDROcodone-acetaminophen 5-325 MG tablet Commonly known as:  NORCO/VICODIN Take 1 tablet by mouth every 4 (four) hours as needed.   losartan 25 MG tablet Commonly known as:  COZAAR Take 25 mg by mouth daily.   montelukast 10 MG tablet Commonly known as:  SINGULAIR TAKE 1 TABLET BY MOUTH EVERY DAY   NIKKI 3-0.02 MG tablet Generic drug:  drospirenone-ethinyl estradiol TAKE 1 TABLET BY MOUTH EVERY DAY   nitroGLYCERIN 0.4 mg/hr patch Commonly known  as:  NITRODUR - Dosed in mg/24 hr Place 1 patch onto the skin daily.   ondansetron 4 MG disintegrating tablet Commonly known as:  ZOFRAN-ODT Take 1 tablet (4 mg total) by mouth every 8 (eight) hours as needed for nausea or vomiting.   PROAIR HFA 108 (90 Base) MCG/ACT inhaler Generic drug:  albuterol Inhale 2 puffs into the lungs every 6 (six) hours as needed for wheezing or shortness of breath.   ranitidine 150 MG tablet Commonly known as:  ZANTAC Take one tablet twice daily   traMADol 50 MG tablet Commonly known as:  ULTRAM Take 50 mg by mouth every 6 (six) hours as needed for mild pain.       Vitals:   06/20/18 1310 06/20/18 1351  BP: (!) 155/89 (!) 141/102  Pulse: 90 88  Resp: 16 18  Temp: 98.6 F (37 C) 98.5 F (36.9 C)  SpO2: 98% 93%    Skin clean, dry and intact without evidence of skin break down, no evidence of skin tears noted. IV catheter discontinued intact. Site without signs and symptoms of complications. Dressing and pressure applied. Pt denies pain at this time. No complaints noted.  An After Visit Summary was printed and given to the patient. Patient escorted via WC, and D/C home via private auto.  [name] 06/20/2018 5:57 PM

## 2018-06-20 NOTE — Op Note (Signed)
06/19/2018 - 06/20/2018  12:28 PM  PATIENT:  Amber Parks  42 y.o. female  Patient Care Team: [facility] as PCP - General (Internal Medicine)  PRE-OPERATIVE DIAGNOSIS:  cholelithiasis  POST-OPERATIVE DIAGNOSIS:  cholelithiasis  PROCEDURE:  LAPAROSCOPIC CHOLECYSTECTOMY WITH INTRAOPERATIVE CHOLANGIOGRAM   Surgeon(s): Romie Levee, MD  ASSISTANT: Barnetta Chapel, PA   ANESTHESIA:   local and general  EBL:  Total I/O In: 800 [I.V.:800] Out: 25 [Blood:25]  DRAINS: none   SPECIMEN:  Source of Specimen:  gallbladder  DISPOSITION OF SPECIMEN:  PATHOLOGY  COUNTS:  YES  PLAN OF CARE: Pt admitted  PATIENT DISPOSITION:  PACU - hemodynamically stable.  INDICATION: 42 year old female who presents to the ED with right upper quadrant pain and elevated bilirubin.  Her bilirubin resolved overnight and it was recommended that we perform a cholecystectomy.  The anatomy & physiology of hepatobiliary & pancreatic function was discussed.  The pathophysiology of gallbladder dysfunction was discussed.  Natural history risks without surgery was discussed.   I feel the risks of no intervention will lead to serious problems that outweigh the operative risks; therefore, I recommended cholecystectomy to remove the pathology.  I explained laparoscopic techniques with possible need for an open approach.  Probable cholangiogram to evaluate the bilary tract was explained as well.    Risks such as bleeding, infection, abscess, leak, injury to other organs, need for further treatment, heart attack, death, and other risks were discussed.  I noted a good likelihood this will help address the problem.  Possibility that this will not correct all abdominal symptoms was explained.  Goals of post-operative recovery were discussed as well.    OR FINDINGS: Cholelithiasis, normal-appearing cholangiogram  DESCRIPTION:   The patient was identified & brought into the operating room. The patient was  positioned supine with arms tucked. SCDs were active during the entire case. The patient underwent general anesthesia without any difficulty.  The abdomen was prepped and draped in a sterile fashion. A Surgical Timeout was performed and confirmed our plan.  We positioned the patient in reverse [place] & right side up.  I placed a Hassan laparoscopic port through the umbilicus using open entry technique.  Entry was clean. There were no adhesions to the anterior abdominal wall supraumbilically.  We induced carbon dioxide insufflation. Camera inspection revealed no injury.    I proceeded to continue with laparoscopic technique. I placed a 5 mm port in mid subcostal region, another 57mm port in the right flank near the anterior axillary line, and a 14mm port in the left subxiphoid region obliquely within the falciform ligament.  I turned attention to the right upper quadrant.  There were minimal adhesions to the gallbladder wall.  The gallbladder fundus was elevated cephalad. I used cautery and blunt dissection to free the peritoneal coverings between the gallbladder and the liver on the posteriolateral and anteriomedial walls.   I used careful blunt and cautery dissection with a maryland dissector to help get a good critical view of the cystic artery and cystic duct. I did further dissection to free a few centimeters of the  gallbladder off the liver bed to get a good critical view of the infundibulum and cystic duct. I mobilized the cystic artery.  I skeletonized the cystic duct.  After getting a good 360 view, I decided to perform / not to perform a cholangiogram.  I placed a clip on the infundibulum.   I did a partial cystic duct-otomy and ensured patency. I placed a 5  F cholangiocatheter through a puncture site at the right subcostal ridge of the abdominal wall and directed it into the cystic duct.  Initially, was not able to get this into the duct.  The duct was milked and a large stone was removed.  I  was then able to get the cholangiogram catheter into the cystic duct.  This was secured with a clip. We ran a cholangiogram with dilute radio-opaque contrast and continuous fluoroscopy.  Contrast flowed from a side branch consistent with cystic duct cannulization. Contrast flowed up the common hepatic duct into the right and left intrahepatic chains out to secondary radicals. Contrast flowed down the common bile duct easily across the normal ampulla into the duodenum.  This was consistent with a normal cholangiogram.  I removed the cholangiocatheter.  I placed clips on the cystic duct x3.  I completed cystic duct transection.   I placed clips on the cystic artery x3 with 2 proximally.  I ligated the cystic artery using scissors. I freed the gallbladder from its remaining attachments to the liver. I ensured hemostasis on the gallbladder fossa of the liver and elsewhere. I inspected the rest of the abdomen & detected no injury nor bleeding elsewhere.  I irrigated the RUQ with normal saline.  I removed the gallbladder through the umbilical port site.  I closed the umbilical fascia using 0 Vicryl stitches.   I closed the skin using 4-0 vicryl stitch.  Sterile dressings were applied. The patient was extubated & arrived in the PACU in stable condition.  I had discussed postoperative care with the patient in the holding area.  I will discuss  operative findings and postoperative goals / instructions with the patient's family.  Instructions are written in the chart as well.

## 2018-06-20 NOTE — Discharge Summary (Signed)
Patient ID: Amber Parks [id number] 05-Dec-1975 42 y.o.  Admit date: 06/19/2018 Discharge date: 06/20/2018  Admitting Diagnosis: Hyperbilirubinemia  cholelithiasis   Discharge Diagnosis Patient Active Problem List   Diagnosis Date Noted  . 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 She has been referred for [place] as outpatient for concern of gallbladder disease but worsened recently prior to completion of outpatient work up. Yesterday the pain got very intense and lasted longer than usual. She came to the ER and felt better by the time the wait was over and was sent home. Today she became very nauseated and was concerned that her urine was very dark. Currently she is not having much abdominal pain.  Procedures Lap chole with IOC, Dr. Maisie Fus, 06-20-18  Hospital Course:  The patient was admitted and underwent a laparoscopic cholecystectomy with IOC.  The patient tolerated the procedure well.  On POD 0, the patient was tolerating a diet, voiding well, mobilizing, and pain was controlled with oral pain medications.  The patient was stable for DC home at this time with appropriate follow up made.     Physical Exam: Abd: soft, appropriately tender, incisions are c/d/i, +BS  Allergies as of 06/20/2018      Reactions   Moxifloxacin Anxiety   Aspirin Nausea And Vomiting   Chlorthalidone Other (See Comments)   Increased heart rate   Pineapple Swelling   Sulfamethoxazole-trimethoprim Nausea And Vomiting   Zoloft [sertraline] Other (See Comments)   Took 1 pill and caused several different problems, HIGHLY allergic! Gives MS symptoms   Ibuprofen Nausea Only      Medication List    TAKE these medications   acetaminophen 500 MG tablet Commonly known as:  TYLENOL Take 1,000 mg by mouth every 6 (six) hours as needed for mild pain.   AUVI-Q 0.3 mg/0.3 mL Soaj injection Generic  drug:  EPINEPHrine Use as directed for life-threatening allergic reaction.   buPROPion 150 MG 24 hr tablet Commonly known as:  WELLBUTRIN XL Take 150 mg by mouth daily.   Cranberry-Vitamin C-Vitamin E 140-100-3 MG-MG-UNIT Caps Take 2 tablets by mouth daily.   fexofenadine 180 MG tablet Commonly known as:  ALLEGRA Take 180 mg by mouth daily as needed for allergies or rhinitis.   FLONASE SENSIMIST 27.5 MCG/SPRAY nasal spray Generic drug:  fluticasone Place 1 spray into the nose daily as needed for rhinitis or allergies.   gabapentin 100 MG capsule Commonly known as:  NEURONTIN Take 100 mg by mouth at bedtime.   HYDROcodone-acetaminophen 5-325 MG tablet Commonly known as:  NORCO/VICODIN Take 1 tablet by mouth every 4 (four) hours as needed.   losartan 25 MG tablet Commonly known as:  COZAAR Take 25 mg by mouth daily.   montelukast 10 MG tablet Commonly known as:  SINGULAIR TAKE 1 TABLET BY MOUTH EVERY DAY   NIKKI 3-0.02 MG tablet Generic drug:  drospirenone-ethinyl estradiol TAKE 1 TABLET BY MOUTH EVERY DAY   nitroGLYCERIN 0.4 mg/hr patch Commonly known as:  NITRODUR - Dosed in mg/24 hr Place 1 patch onto the skin daily.   ondansetron 4 MG disintegrating tablet Commonly known as:  ZOFRAN-ODT Take 1 tablet (4 mg total) by mouth every 8 (eight) hours as needed for nausea or vomiting.   PROAIR HFA 108 (90 Base) MCG/ACT inhaler Generic drug:  albuterol Inhale 2 puffs into the lungs every 6 (six) hours as needed  for wheezing or shortness of breath.   ranitidine 150 MG tablet Commonly known as:  ZANTAC Take one tablet twice daily   traMADol 50 MG tablet Commonly known as:  ULTRAM Take 50 mg by mouth every 6 (six) hours as needed for mild pain.        Follow-up Information    [facility], [place]. Call.   Specialty:  General Surgery Why:  to confirm post-operative follow up appointment date/time. Contact information: [address] [place] [address] [phone]          Signed: Barnetta Chapel, [facility] 06/20/2018, 4:32 PM Pager: [phone]

## 2018-06-21 ENCOUNTER — Encounter (HOSPITAL_COMMUNITY): Payer: Self-pay | Admitting: General Surgery

## 2018-06-21 NOTE — Anesthesia Postprocedure Evaluation (Signed)
Anesthesia Post Note  Patient: Amber Parks  Procedure(s) Performed: LAPAROSCOPIC CHOLECYSTECTOMY WITH INTRAOPERATIVE CHOLANGIOGRAM (N/A Abdomen)     Patient location during evaluation: PACU Anesthesia Type: General Level of consciousness: awake and alert Pain management: pain level controlled Vital Signs Assessment: post-procedure vital signs reviewed and stable Respiratory status: spontaneous breathing, nonlabored ventilation, respiratory function stable and patient connected to nasal cannula oxygen Cardiovascular status: blood pressure returned to baseline and stable Postop Assessment: no apparent nausea or vomiting Anesthetic complications: no    Last Vitals:  Vitals:   06/20/18 1310 06/20/18 1351  BP: (!) 155/89 (!) 141/102  Pulse: 90 88  Resp: 16 18  Temp: 37 C 36.9 C  SpO2: 98% 93%    Last Pain:  Vitals:   06/20/18 1351  TempSrc: Oral  PainSc:                  Kennieth Rad

## 2019-06-08 IMAGING — RF DG CHOLANGIOGRAM OPERATIVE
1 series · 5 of 5 positions shown · non-contrast
Comparison: None.

CLINICAL DATA: Gallstones

EXAM:
INTRAOPERATIVE CHOLANGIOGRAM
TECHNIQUE: Cholangiographic images from the C-arm fluoroscopic device were
submitted for interpretation post-operatively. Please see the
procedural report for the amount of contrast and the fluoroscopy
time utilized.

[Series 1: run · 2 acquisitions, 5 frames shown]
[im 1/2]
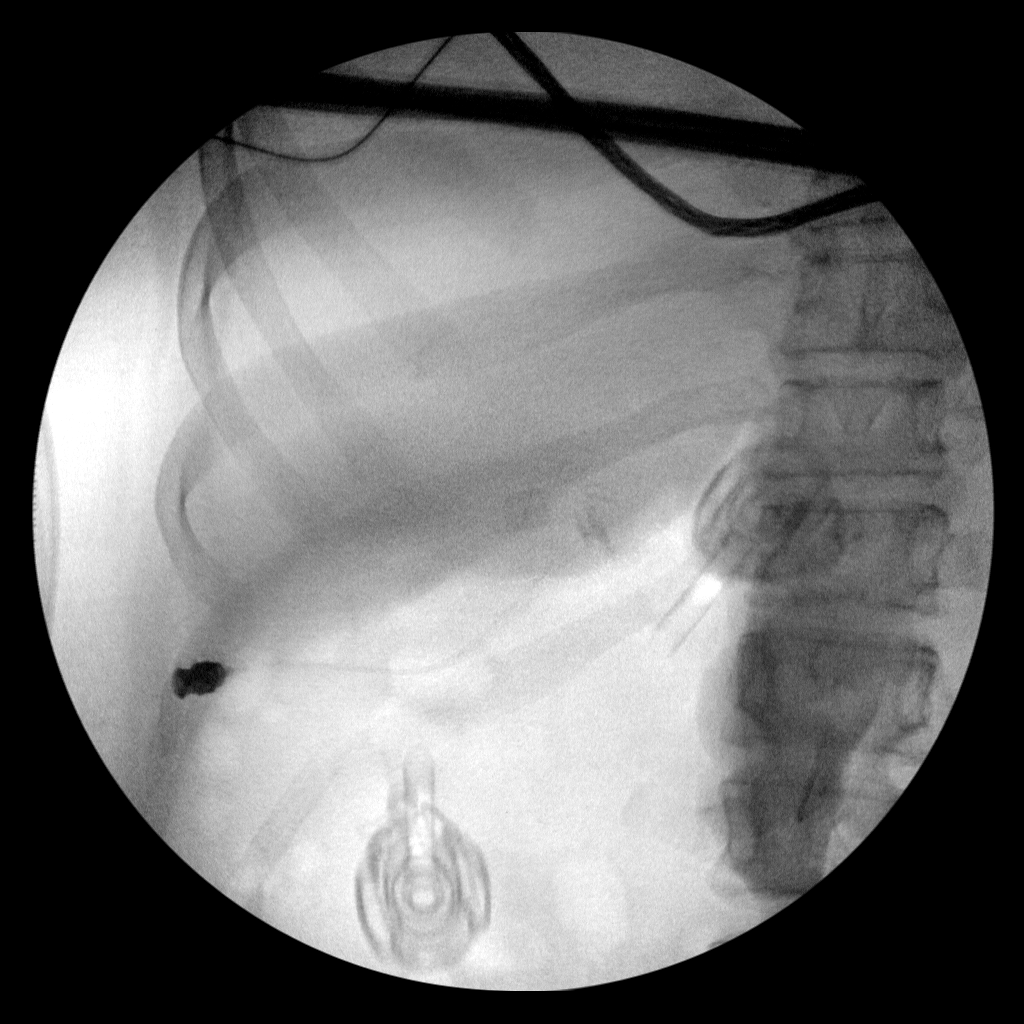
[im 2/2]
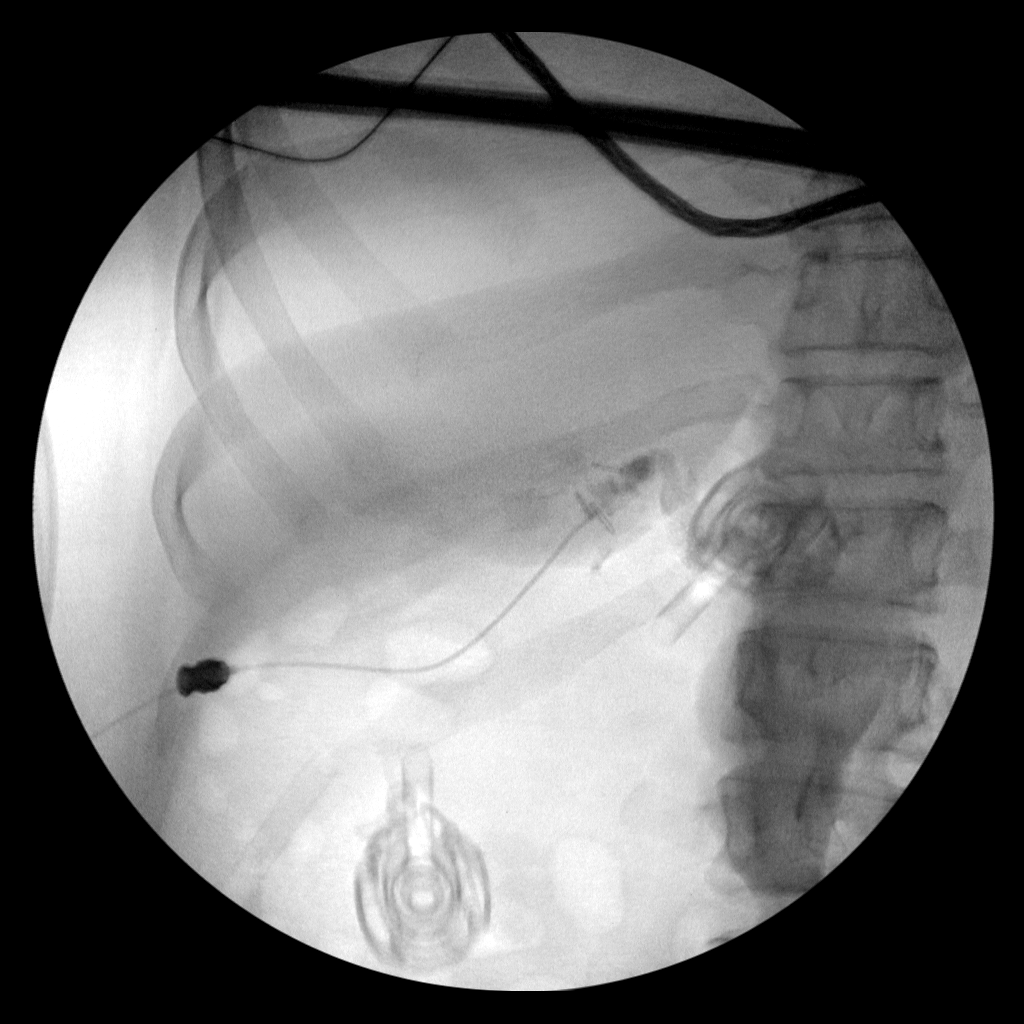
[im 2/2]
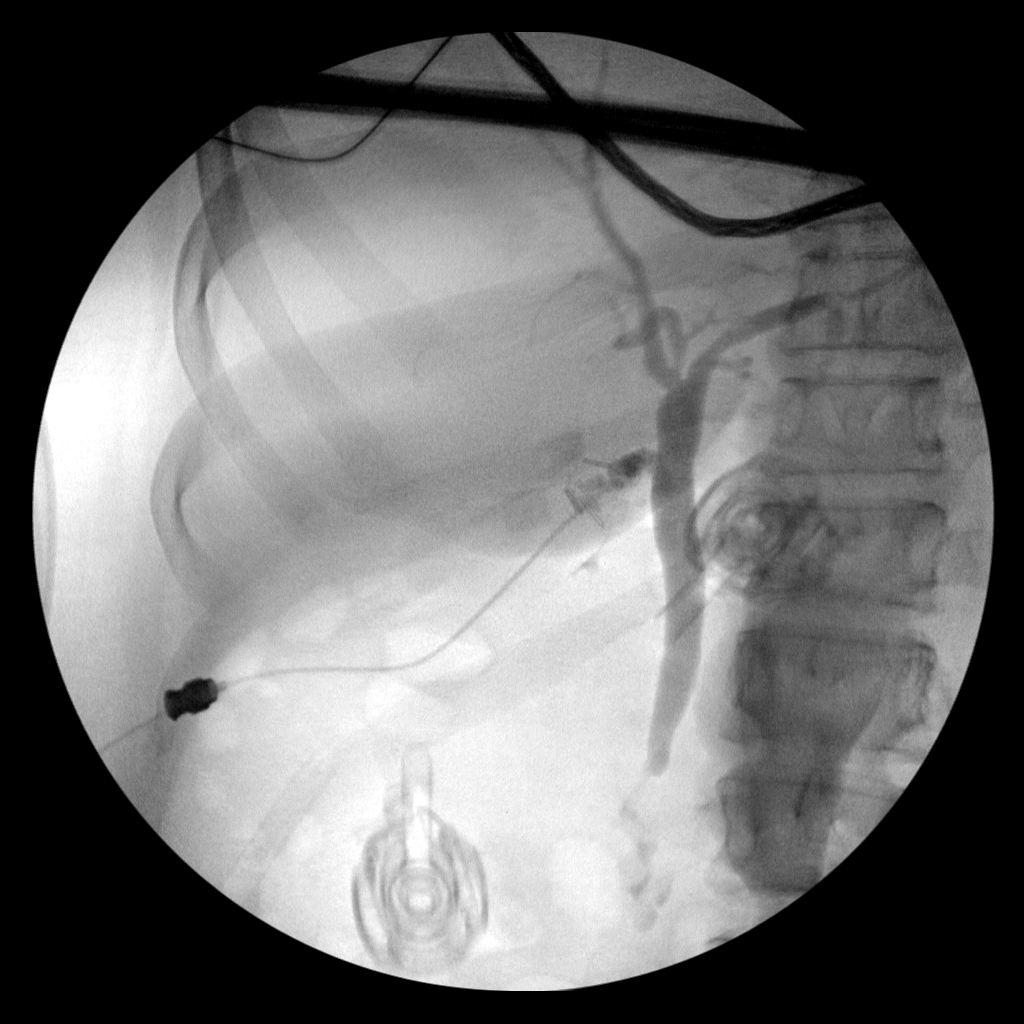
[im 2/2]
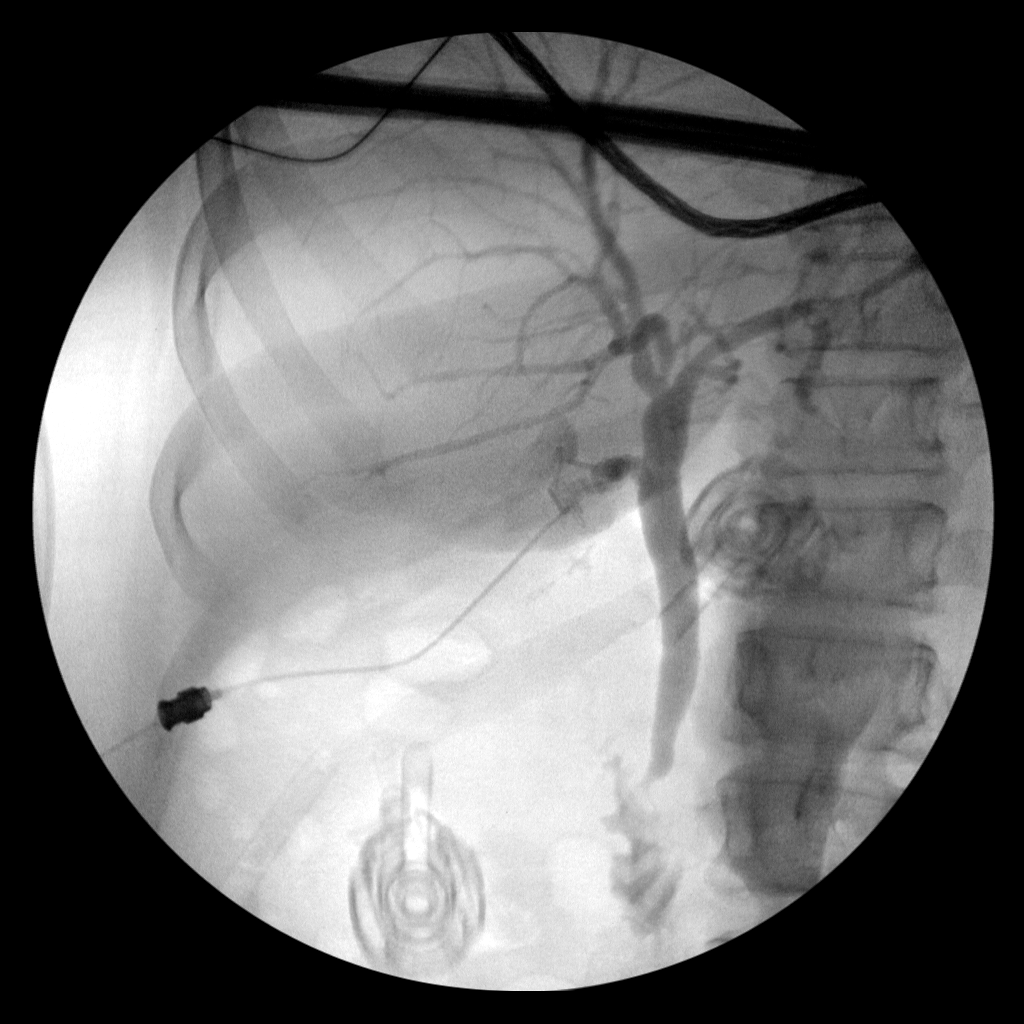
[im 2/2]
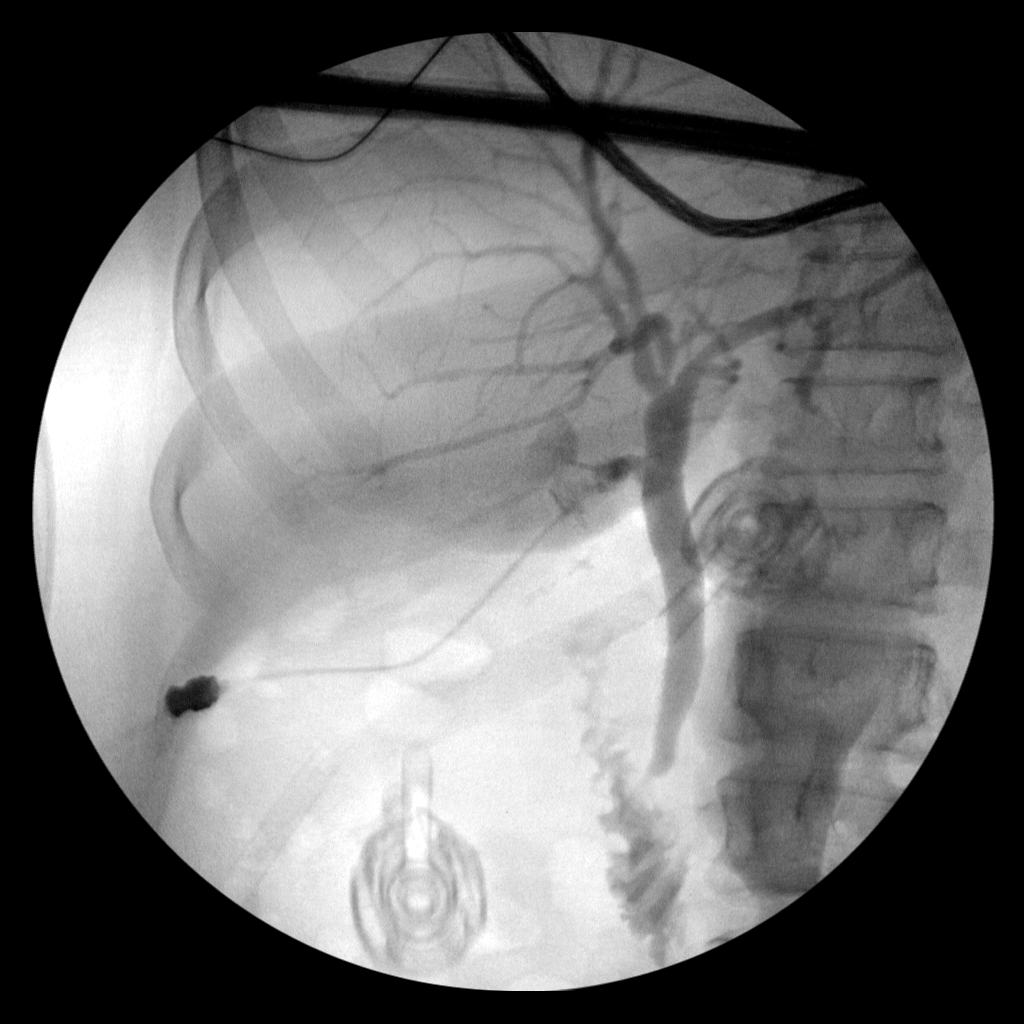

[5 of 5 positions shown; findings below may reference images not displayed]

FINDINGS: Contrast fills the biliary tree and duodenum without filling defects
in the common bile duct.
IMPRESSION: Patent biliary tree.

## 2019-08-28 ENCOUNTER — Other Ambulatory Visit: Payer: Self-pay | Admitting: Allergy and Immunology

## 2019-10-04 IMAGING — US US ABDOMEN LIMITED
1 series · 14 of 25 positions shown · non-contrast
Comparison: None.

CLINICAL DATA: Right upper quadrant abdominal pain

EXAM:
ULTRASOUND ABDOMEN LIMITED RIGHT UPPER QUADRANT

[Series 1: us abdomen limited · 0.20mm/px · 14 of 42 slices shown]
[im 1/42]
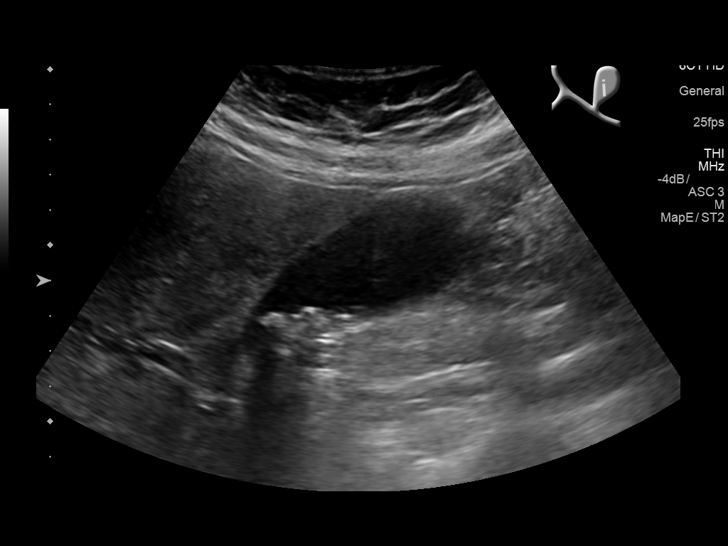
[im 4/42]
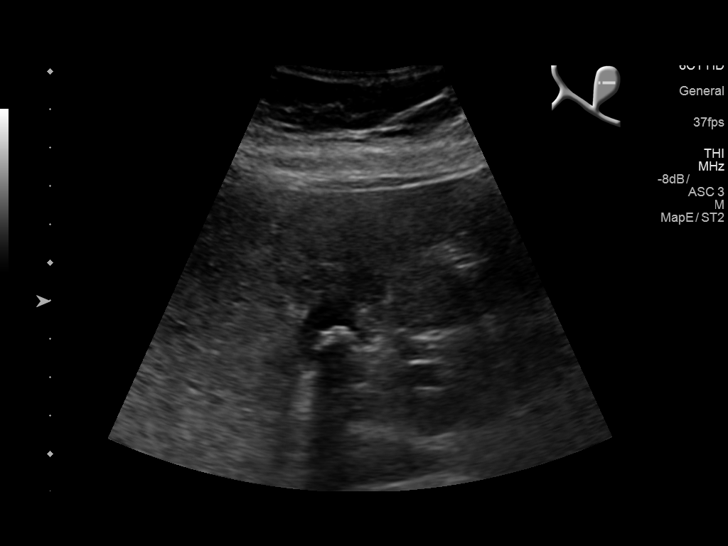
[im 7/42]
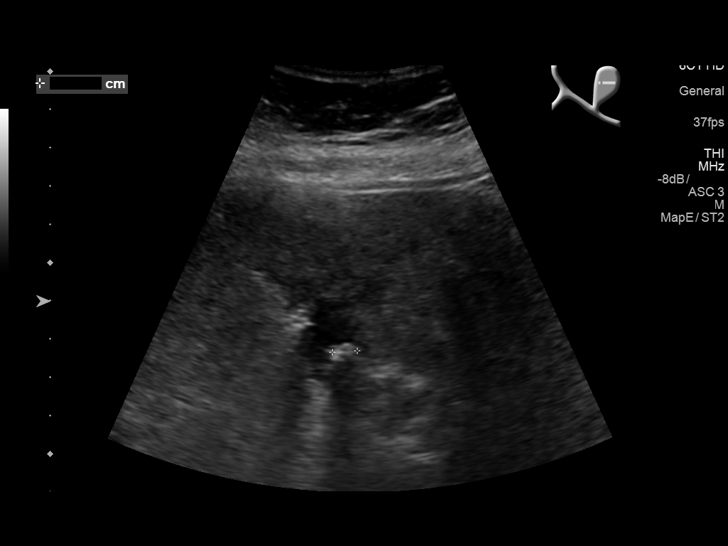
[im 11/42]
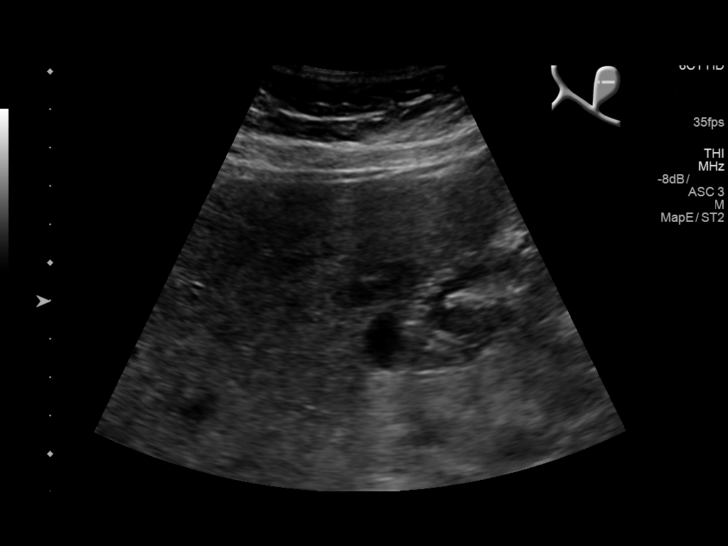
[im 14/42]
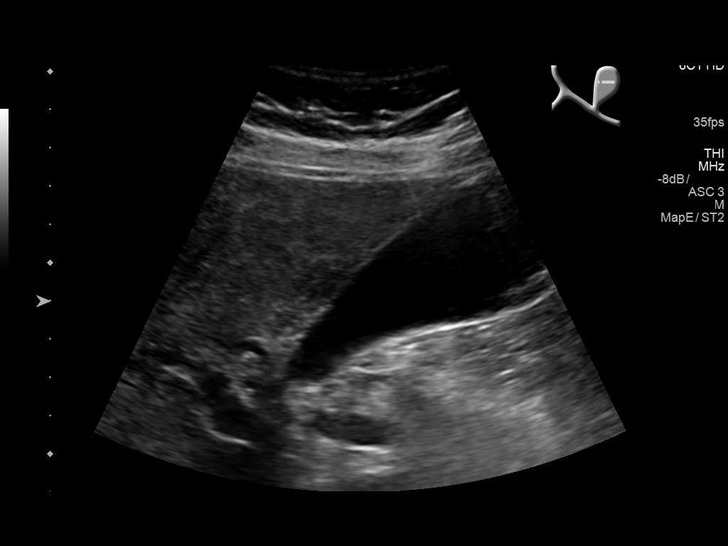
[im 16/42]
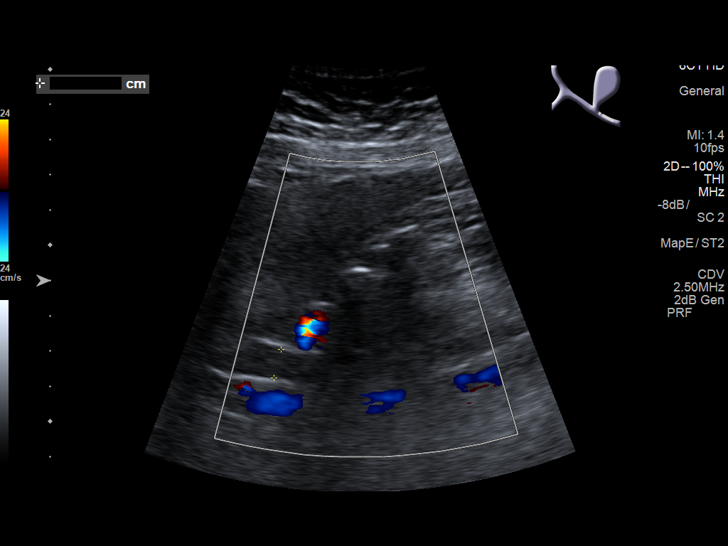
[im 19/42]
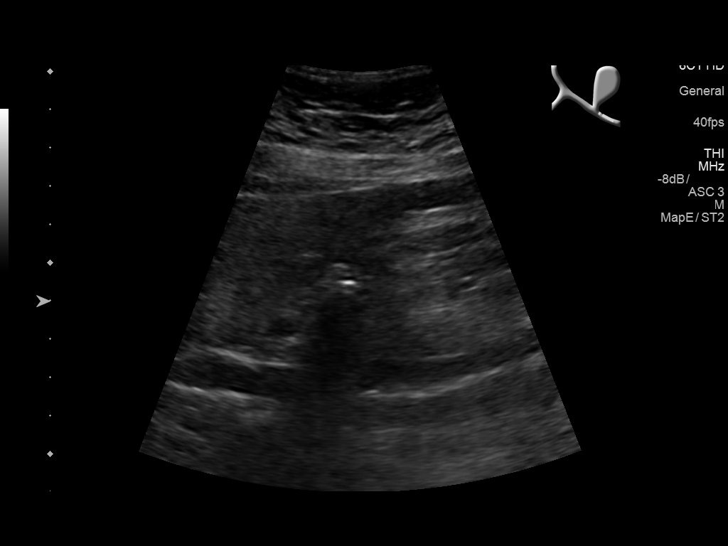
[im 23/42]
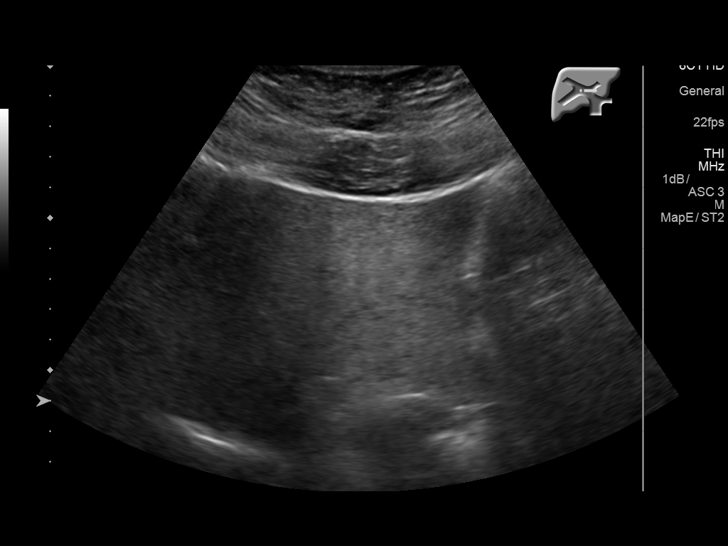
[im 26/42]
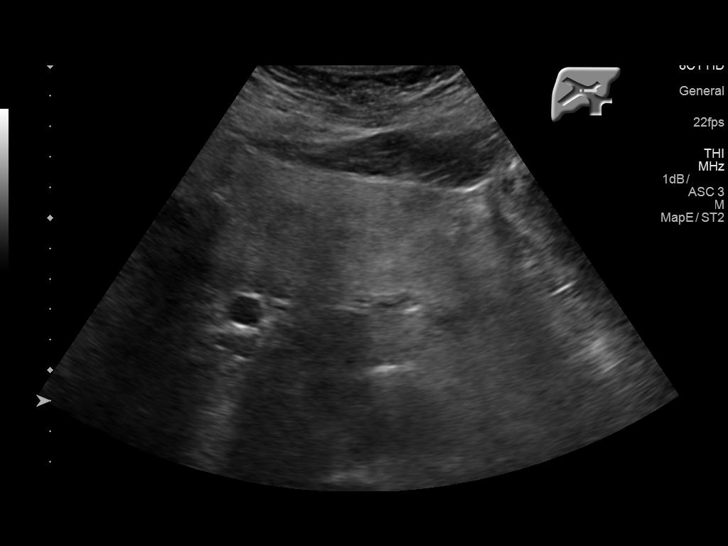
[im 28/42]
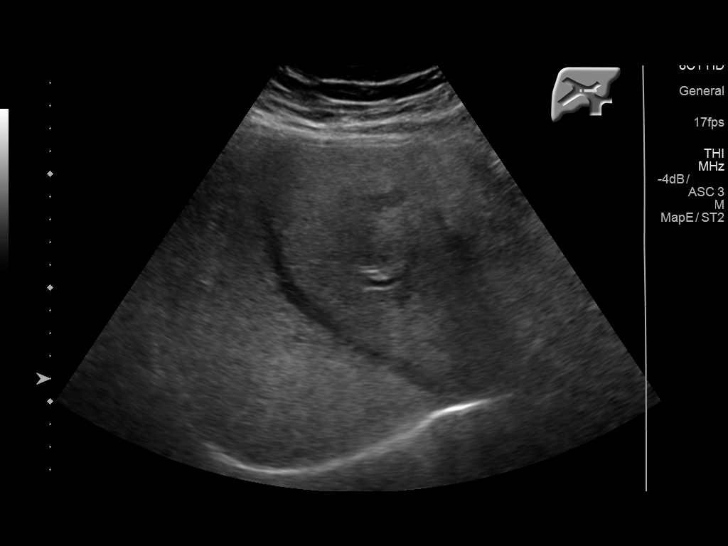
[im 31/42]
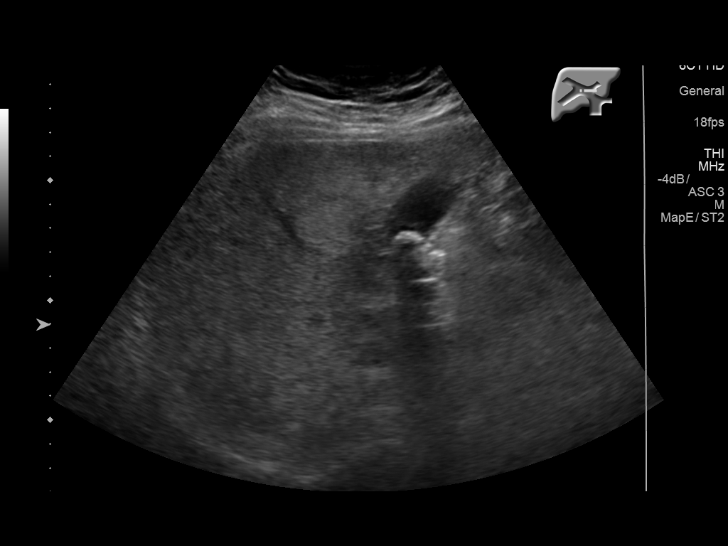
[im 35/42]
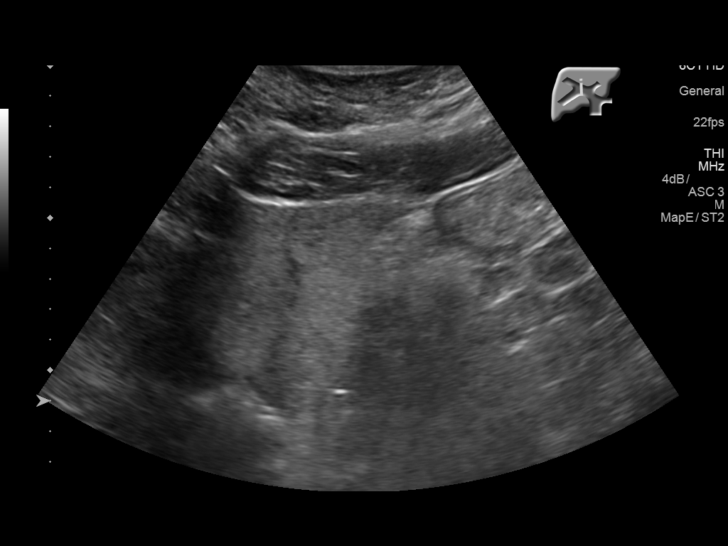
[im 38/42]
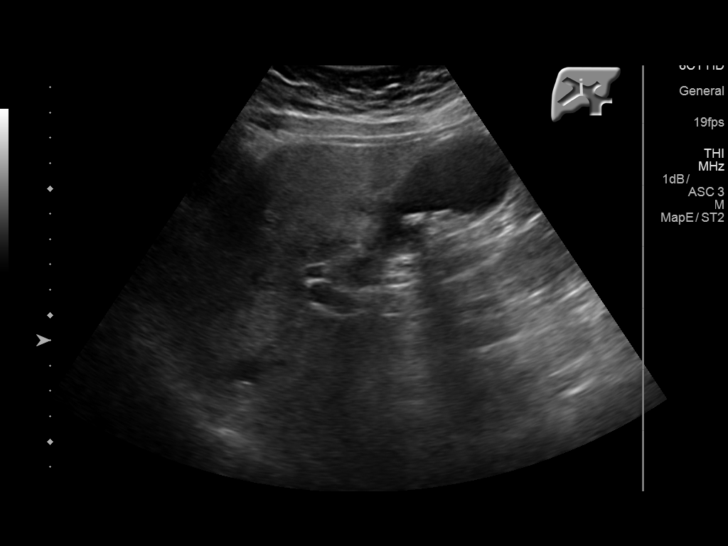
[im 42/42]
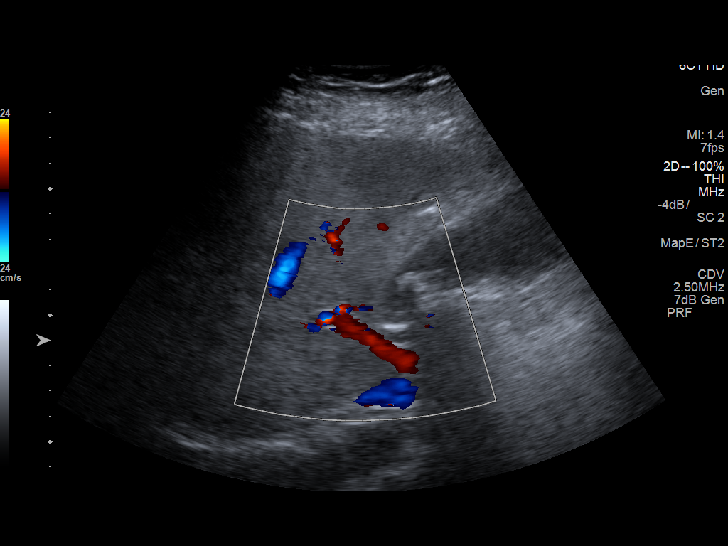

[14 of 25 positions shown; findings below may reference images not displayed]

FINDINGS: Gallbladder:

Multiple layering shadowing subcentimeter gallstones in the
gallbladder. No gallbladder wall thickening. No significant
gallbladder distention. No pericholecystic fluid. No sonographic
Murphy sign.

Common bile duct:

Diameter: 8 mm

Liver:

Liver parenchyma is diffusely echogenic with posterior acoustic
attenuation, most compatible with diffuse hepatic steatosis. No
definite liver surface irregularity. No liver masses demonstrated,
noting decreased sensitivity in the setting of an echogenic liver.
Portal vein is patent on color Doppler imaging with normal direction
of blood flow towards the liver.
IMPRESSION: 1. Cholelithiasis.  No evidence of acute cholecystitis.
2. Dilated common bile duct (8 mm diameter). Obstructing
choledocholithiasis cannot be excluded. Recommend correlation with
serum bilirubin levels. MRI abdomen with MRCP may be considered for
further evaluation, as clinically warranted.
3. Moderate diffuse hepatic steatosis.

## 2020-12-22 ENCOUNTER — Emergency Department (HOSPITAL_COMMUNITY)
Admission: EM | Admit: 2020-12-22 | Discharge: 2020-12-22 | Disposition: A | Discharge status: Home / Self Care | Payer: BLUE CROSS/BLUE SHIELD | Attending: Emergency Medicine | Admitting: Emergency Medicine

## 2020-12-22 ENCOUNTER — Encounter (HOSPITAL_COMMUNITY): Payer: Self-pay

## 2020-12-22 DIAGNOSIS — R110 Nausea: Secondary | ICD-10-CM | POA: Diagnosis not present

## 2020-12-22 DIAGNOSIS — K219 Gastro-esophageal reflux disease without esophagitis: Secondary | ICD-10-CM | POA: Insufficient documentation

## 2020-12-22 DIAGNOSIS — Z79899 Other long term (current) drug therapy: Secondary | ICD-10-CM | POA: Insufficient documentation

## 2020-12-22 DIAGNOSIS — I10 Essential (primary) hypertension: Secondary | ICD-10-CM | POA: Diagnosis not present

## 2020-12-22 DIAGNOSIS — F419 Anxiety disorder, unspecified: Secondary | ICD-10-CM | POA: Diagnosis present

## 2020-12-22 DIAGNOSIS — F410 Panic disorder [episodic paroxysmal anxiety] without agoraphobia: Secondary | ICD-10-CM

## 2020-12-22 LAB — COMPREHENSIVE METABOLIC PANEL
ALT: 31 U/L (ref 0–44)
AST: 23 U/L (ref 15–41)
Albumin: 4.6 g/dL (ref 3.5–5.0)
Alkaline Phosphatase: 70 U/L (ref 38–126)
Anion gap: 14 (ref 5–15)
BUN: 6 mg/dL (ref 6–20)
CO2: 21 mmol/L — ABNORMAL LOW (ref 22–32)
Calcium: 9.8 mg/dL (ref 8.9–10.3)
Chloride: 103 mmol/L (ref 98–111)
Creatinine, Ser: 0.75 mg/dL (ref 0.44–1.00)
GFR, Estimated: >60 mL/min (ref >60)
Glucose, Bld: 89 mg/dL (ref 70–99)
Potassium: 3.7 mmol/L (ref 3.5–5.1)
Sodium: 138 mmol/L (ref 135–145)
Total Bilirubin: 0.9 mg/dL (ref 0.3–1.2)
Total Protein: 7.4 g/dL (ref 6.5–8.1)

## 2020-12-22 LAB — CBC WITH DIFFERENTIAL/PLATELET
Abs Immature Granulocytes: 0.04 10*3/uL (ref 0.00–0.07)
Basophils Absolute: 0.1 10*3/uL (ref 0.0–0.1)
Basophils Relative: 1 %
Eosinophils Absolute: 0 10*3/uL (ref 0.0–0.5)
Eosinophils Relative: 0 %
HCT: 46.6 % — ABNORMAL HIGH (ref 36.0–46.0)
Hemoglobin: 15.7 g/dL — ABNORMAL HIGH (ref 12.0–15.0)
Immature Granulocytes: 0 %
Lymphocytes Relative: 14 %
Lymphs Abs: 1.7 10*3/uL (ref 0.7–4.0)
MCH: 33.3 pg (ref 26.0–34.0)
MCHC: 33.7 g/dL (ref 30.0–36.0)
MCV: 98.9 fL (ref 80.0–100.0)
Monocytes Absolute: 0.5 10*3/uL (ref 0.1–1.0)
Monocytes Relative: 4 %
Neutro Abs: 9.6 10*3/uL — ABNORMAL HIGH (ref 1.7–7.7)
Neutrophils Relative %: 81 %
Platelets: 270 10*3/uL (ref 150–400)
RBC: 4.71 MIL/uL (ref 3.87–5.11)
RDW: 12.4 % (ref 11.5–15.5)
WBC: 11.9 10*3/uL — ABNORMAL HIGH (ref 4.0–10.5)
nRBC: 0 % (ref 0.0–0.2)

## 2020-12-22 LAB — URINALYSIS, ROUTINE W REFLEX MICROSCOPIC
Bilirubin Urine: NEGATIVE
Glucose, UA: NEGATIVE mg/dL
Ketones, ur: 5 mg/dL — AB
Leukocytes,Ua: NEGATIVE
Nitrite: NEGATIVE
Protein, ur: NEGATIVE mg/dL
Specific Gravity, Urine: 1.003 — ABNORMAL LOW (ref 1.005–1.030)
pH: 6 (ref 5.0–8.0)

## 2020-12-22 LAB — LIPASE, BLOOD: Lipase: 35 U/L (ref 11–51)

## 2020-12-22 LAB — POC URINE PREG, ED: Preg Test, Ur: NEGATIVE

## 2020-12-22 MED ORDER — LORAZEPAM 0.5 MG PO TABS
0.5000 mg | ORAL_TABLET | Freq: Once | ORAL | Status: AC
  Administered 2020-12-22: 0.5 mg via ORAL
  Filled 2020-12-22: qty 1

## 2020-12-22 MED ORDER — ACETAMINOPHEN 325 MG PO TABS
650.0000 mg | ORAL_TABLET | Freq: Once | ORAL | Status: AC
  Administered 2020-12-22: 650 mg via ORAL
  Filled 2020-12-22: qty 2

## 2020-12-22 MED ORDER — HYDROXYZINE HCL 25 MG PO TABS: 25.0000 mg | ORAL_TABLET | Freq: Four times a day (QID) | ORAL | 0 refills | Status: AC | PRN

## 2020-12-22 MED ORDER — ONDANSETRON 4 MG PO TBDP
4.0000 mg | ORAL_TABLET | Freq: Once | ORAL | Status: AC
  Administered 2020-12-22: 4 mg via ORAL
  Filled 2020-12-22: qty 1

## 2020-12-22 MED ORDER — HYDRALAZINE HCL 25 MG PO TABS
100.0000 mg | ORAL_TABLET | Freq: Once | ORAL | Status: AC
  Administered 2020-12-22: 100 mg via ORAL
  Filled 2020-12-22: qty 4

## 2020-12-22 MED ORDER — ALUM & MAG HYDROXIDE-SIMETH 200-200-20 MG/5ML PO SUSP
30.0000 mL | Freq: Once | ORAL | Status: AC
  Administered 2020-12-22: 30 mL via ORAL
  Filled 2020-12-22: qty 30

## 2020-12-22 MED ORDER — ONDANSETRON 4 MG PO TBDP: 4.0000 mg | ORAL_TABLET | Freq: Once | ORAL | Status: DC

## 2020-12-22 MED ORDER — ALPRAZOLAM 0.5 MG PO TABS: 0.5000 mg | ORAL_TABLET | Freq: Three times a day (TID) | ORAL | 0 refills | Status: AC | PRN

## 2020-12-22 MED ORDER — LORAZEPAM 2 MG/ML IJ SOLN
1.0000 mg | Freq: Once | INTRAMUSCULAR | Status: AC
  Administered 2020-12-22: 1 mg via INTRAVENOUS
  Filled 2020-12-22: qty 1

## 2020-12-22 MED ORDER — SODIUM CHLORIDE 0.9 % IV BOLUS
1000.0000 mL | Freq: Once | INTRAVENOUS | Status: AC
  Administered 2020-12-22: 1000 mL via INTRAVENOUS

## 2020-12-22 NOTE — ED Provider Notes (Signed)
Care assumed at shift change from [facility], PA-C, pending re-evaluation after ativan. See their note for full HPI and workup. Briefly, pt presenting with worsening anxiety, hx of the same, on wellbutrin.  Patient is noted to be tachycardic today, laboratory work-up otherwise is reassuring.  Was noted to have low risk Wells for PE, no symptoms of the same.  Symptoms are typical of her anxiety.  Physical Exam  BP (!) 155/105   Pulse (!) 119   Temp 97.6 F (36.4 C) (Oral)   Resp 13   SpO2 100%   Physical Exam Constitutional:      Comments: Patient is resting upon entering the room.  She does appear mildly anxious and intermittently tearful throughout entire discussion.  These changes in her emotions have been observed effect on her heart rate which fluctuates throughout evaluation.  Cardiovascular:     Rate and Rhythm: Regular rhythm. Tachycardia present.     ED Course/Procedures     Procedures  MDM  Patient feels much better after Ativan.  She does appear mildly anxious on examination though is not in significant distress.  Heart rate remains slightly elevated.  Had long discussion with patient, discussion about her chronic medical problems and medication management, as well as outpatient follow-up for her anxiety.  There remains low suspicion for PE or other concerning cause of her tachycardia.  Her heart rate was noted to fluctuate throughout the evaluation with patient's mood changes and perceived anxiety.  Her heart rate is likely due to her anxiety.  Patient feels comfortable with discharge to home.  Will prescribe medications for symptom management.  She is also provided with information for behavioral health urgent care.  Patient is discharged in no acute distress.       Robinson, [place] N, PA-C 12/22/20 1950    Maia Plan, MD 12/24/20 [phone]

## 2020-12-22 NOTE — ED Provider Notes (Signed)
[facility]-EMERGENCY DEPT Provider Note   CSN: [id number] Arrival date & time: 12/22/20  1119     History Chief Complaint  Patient presents with  . Anxiety    Amber Parks is a 45 y.o. female with past medical history of hypertension, anxiety, GERD that presents the emergency department today for anxiety and nausea.  Patient states that she went to urgent care today, her blood pressure was up to 176/120, has been consistently up she is working with her PCP to manage her hypertension, however since she is having hypertension and a moderate frontal headache they sent her to the emergency department for basic labs.  Patient does not have any confusion, normal neuro exam.  Patient states that she has been nauseous for multiple months, states that she normally is nauseous when her GERD flares up.  She thinks that this is her GERD, did have 2 episodes of vomiting in the morning.  States that this is normal for her.  Has been on her PPI and her Pepcid.  Feels slightly nauseous now.  States that she is having side effects from her losartan after they upped her dose last month, states that she just changed to amlodipine on 1 March, this was not controlling her blood pressure so she increased her dose to 10 mg of amlodipine on Friday.  Patient states that this has been increasing her anxiety, is on Wellbutrin for this.  She thinks her anxiety is worsening her GERD.  Did take 5 mg of amlodipine this morning.  Denies any fevers chills.  Denies any chest pain or shortness of breath.  Denies any vision changes, numbness tingling, gait abnormality.  Denies any dysuria or hematuria.  Has had a cholecystectomy, she does have some abdominal pain after vomiting, in her epigastric region.  States this is common for her GERD.  She states that she is mainly here because she was anxious that her labs would be abnormal since she has had hypertension.    HPI     Past Medical History:  Diagnosis  Date  . Anemia   . Anxiety   . Gall bladder inflammation   . Infection    UTI  . Vaginal Pap smear, abnormal    f/u ok    Patient Active Problem List   Diagnosis Date Noted  . 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 CHOLECYSTECTOMY N/A 06/20/2018   Procedure: LAPAROSCOPIC CHOLECYSTECTOMY WITH INTRAOPERATIVE CHOLANGIOGRAM;  Surgeon: Romie Levee, MD;  Location: [facility];  Service: General;  Laterality: N/A;  . TYMPANOSTOMY TUBE PLACEMENT       OB History    Gravida  0   Para  0   Term  0   Preterm  0   AB  0   Living  0     SAB  0   IAB  0   Ectopic  0   Multiple  0   Live Births              Family History  Problem Relation Age of Onset  . Hypertension Mother   . Stroke Mother   . Hyperlipidemia Father   . Hypertension Maternal Grandmother   . Pulmonary fibrosis Maternal Grandmother   . Breast cancer Paternal Grandmother     Social History   Tobacco Use  . Smoking status: Never Smoker  . Smokeless tobacco: Never Used  Vaping Use  . Vaping Use: Never used  Substance Use Topics  . Alcohol use: No  . Drug use: No    Home Medications Prior to Admission medications   Medication Sig Start Date End Date Taking? Authorizing Provider  acetaminophen (TYLENOL) 500 MG tablet Take 1,000 mg by mouth every 6 (six) hours as needed for mild pain.    [provider]  albuterol (PROAIR HFA) 108 (90 Base) MCG/ACT inhaler Inhale 2 puffs into the lungs every 6 (six) hours as needed for wheezing or shortness of breath.  11/15/15   [provider]  amLODipine (NORVASC) 5 MG tablet Take 5 mg by mouth daily. 12/09/20   [provider]  AUVI-Q 0.3 MG/0.3ML SOAJ injection Use as directed for life-threatening allergic reaction. 04/13/17   Kozlow, Alvira Philips, MD  buPROPion (WELLBUTRIN XL) 150 MG 24 hr tablet Take 150 mg by mouth daily.    [provider]  Cranberry-Vitamin C-Vitamin  E 140-100-3 MG-MG-UNIT CAPS Take 2 tablets by mouth daily.     [provider]  drospirenone-ethinyl estradiol (NIKKI) 3-0.02 MG tablet TAKE 1 TABLET BY MOUTH EVERY DAY 09/11/16   [provider]  fexofenadine (ALLEGRA) 180 MG tablet Take 180 mg by mouth daily as needed for allergies or rhinitis.     [provider]  fluticasone (FLONASE SENSIMIST) 27.5 MCG/SPRAY nasal spray Place 1 spray into the nose daily as needed for rhinitis or allergies.     [provider]  gabapentin (NEURONTIN) 100 MG capsule Take 100 mg by mouth at bedtime.  02/25/17   [provider]  HYDROcodone-acetaminophen (NORCO/VICODIN) 5-325 MG tablet Take 1 tablet by mouth every 4 (four) hours as needed. 06/18/18   Jeannie Fend, PA-C  losartan (COZAAR) 25 MG tablet Take 25 mg by mouth daily.  04/09/17   [provider]  losartan (COZAAR) 50 MG tablet Take 50 mg by mouth daily. 09/11/20   [provider]  montelukast (SINGULAIR) 10 MG tablet TAKE 1 TABLET BY MOUTH EVERY DAY 02/11/18   Kozlow, Alvira Philips, MD  nitroGLYCERIN (NITRODUR - DOSED IN MG/24 HR) 0.4 mg/hr patch Place 1 patch onto the skin daily. 04/20/18   [provider]  omeprazole (PRILOSEC) 20 MG capsule Take 20 mg by mouth daily. 12/06/20   [provider]  ondansetron (ZOFRAN ODT) 4 MG disintegrating tablet Take 1 tablet (4 mg total) by mouth every 8 (eight) hours as needed for nausea or vomiting. 06/18/18   Jeannie Fend, PA-C  potassium chloride SA (KLOR-CON) 20 MEQ tablet Take 20 mEq by mouth 2 (two) times daily. 12/16/20   [provider]  ranitidine (ZANTAC) 150 MG tablet Take one tablet twice daily 02/15/18   Kozlow, Alvira Philips, MD  traMADol (ULTRAM) 50 MG tablet Take 50 mg by mouth every 6 (six) hours as needed for mild pain. 01/01/18   [provider]    Allergies    Moxifloxacin, Aspirin, Chlorthalidone, Pineapple, Sulfamethoxazole-trimethoprim, Zoloft [sertraline], and  Ibuprofen  Review of Systems   Review of Systems  Constitutional: Negative for chills, diaphoresis, fatigue and fever.  HENT: Negative for congestion, sore throat and trouble swallowing.   Eyes: Negative for pain and visual disturbance.  Respiratory: Negative for cough, shortness of breath and wheezing.   Cardiovascular: Negative for chest pain, palpitations and leg swelling.  Gastrointestinal: Positive for nausea. Negative for abdominal distention, abdominal pain, diarrhea and vomiting.  Genitourinary: Negative for difficulty urinating.  Musculoskeletal: Negative for back pain, neck pain and neck stiffness.  Skin: Negative for pallor.  Neurological: Negative for dizziness, speech difficulty, weakness and headaches.  Psychiatric/Behavioral: Negative for confusion. The patient is nervous/anxious.     Physical Exam Updated Vital Signs BP (!) 152/97   Pulse (!) 119   Temp 97.6 F (36.4 C) (Oral)   Resp 13   SpO2 100%   Physical Exam Constitutional:      General: She is not in acute distress.    Appearance: Normal appearance. She is not ill-appearing, toxic-appearing or diaphoretic.  HENT:     Mouth/Throat:     Mouth: Mucous membranes are moist.     Pharynx: Oropharynx is clear.  Eyes:     General: No scleral icterus.    Extraocular Movements: Extraocular movements intact.     Pupils: Pupils are equal, round, and reactive to light.  Cardiovascular:     Rate and Rhythm: Normal rate and regular rhythm.     Pulses: Normal pulses.     Heart sounds: Normal heart sounds.  Pulmonary:     Effort: Pulmonary effort is normal. No respiratory distress.     Breath sounds: Normal breath sounds. No stridor. No wheezing, rhonchi or rales.  Chest:     Chest wall: No tenderness.  Abdominal:     General: Abdomen is flat. There is no distension.     Palpations: Abdomen is soft.     Tenderness: There is no abdominal tenderness. There is no guarding or rebound.  Musculoskeletal:         General: No swelling or tenderness. Normal range of motion.     Cervical back: Normal range of motion and neck supple. No rigidity.     Right lower leg: No edema.     Left lower leg: No edema.  Skin:    General: Skin is warm and dry.     Capillary Refill: Capillary refill takes less than 2 seconds.     Coloration: Skin is not pale.  Neurological:     General: No focal deficit present.     Mental Status: She is alert and oriented to person, place, and time.     Comments: Alert. Clear speech. No facial droop. CNIII-XII grossly intact. Bilateral upper and lower extremities' sensation grossly intact. 5/5 symmetric strength with grip strength and with plantar and dorsi flexion bilaterally.Normal finger to nose bilaterally. Negative pronator drift. Negative Romberg sign. Gait is steady and intact.    Psychiatric:     Comments: Extremely anxious      ED Results / Procedures / Treatments   Labs (all labs ordered are listed, but only abnormal results are displayed) Labs Reviewed  COMPREHENSIVE METABOLIC PANEL - Abnormal; Notable for the following components:      Result Value   CO2 21 (*)    All other components within normal limits  URINALYSIS, ROUTINE W REFLEX MICROSCOPIC - Abnormal; Notable for the following components:   APPearance HAZY (*)    Specific Gravity, Urine 1.003 (*)    Hgb urine dipstick SMALL (*)    Ketones, ur 5 (*)    Bacteria, UA RARE (*)    All other components within normal limits  CBC WITH DIFFERENTIAL/PLATELET - Abnormal; Notable for the following components:   WBC 11.9 (*)    Hemoglobin 15.7 (*)    HCT 46.6 (*)    Neutro Abs 9.6 (*)    All other components within normal limits  LIPASE, BLOOD  POC URINE PREG, ED    EKG EKG Interpretation  Date/Time:  Sunday December 22 2020 13:10:19 EDT  Ventricular Rate:  112 PR Interval:    QRS Duration: 85 QT Interval:  298 QTC Calculation: 407 R Axis:   116 Text Interpretation: Sinus tachycardia Ventricular premature  complex Right axis deviation Borderline T abnormalities, inferior leads 12 Lead; Mason-Likar No STEMI Confirmed by Alona Bene [phone]) on 12/22/2020 1:12:33 PM   Radiology No results found.  Procedures Procedures   Medications Ordered in ED Medications  LORazepam (ATIVAN) injection 1 mg (has no administration in time range)  alum & mag hydroxide-simeth (MAALOX/MYLANTA) 200-200-20 MG/5ML suspension 30 mL (30 mLs Oral Given 12/22/20 1217)  ondansetron (ZOFRAN-ODT) disintegrating tablet 4 mg (4 mg Oral Given 12/22/20 1217)  hydrALAZINE (APRESOLINE) tablet 100 mg (100 mg Oral Given 12/22/20 1306)  acetaminophen (TYLENOL) tablet 650 mg (650 mg Oral Given 12/22/20 1303)  sodium chloride 0.9 % bolus 1,000 mL (0 mLs Intravenous Stopped 12/22/20 1422)  LORazepam (ATIVAN) tablet 0.5 mg (0.5 mg Oral Given 12/22/20 1436)    ED Course  I have reviewed the triage vital signs and the nursing notes.  Pertinent labs & imaging results that were available during my care of the patient were reviewed by me and considered in my medical decision making (see chart for details).    MDM Rules/Calculators/A&P                         Amber Parks is a 44 y.o. female with past medical history of hypertension, anxiety, GERD that presents the emergency department today for anxiety and nausea with elevated blood pressure that was sent from urgent care.  Patient has normal neuro exam, has nausea due to GERD.  Patient states this is common for her.  Normal abdominal exam.  Patient does not look septic, appears well however is very anxious. BP 180/116, will give hydralazine.  Do not suspect hypertensive urgency, mild headache without no confusion, no altered mental status and normal neuro exam.  Is here for basic labs, is not complaining of any chest pain or shortness of breath.  Has a dull frontal headache which is been persistent for multiple months.  Think patient symptoms are most likely stemming from patient's anxiety  causing her blood pressure to be increased and causing her GERD.  Patient agrees with this.  Upon reevaluation, patient started having panic attack.  Pulse showed up to about 120, patient's face is flushed and she states that she feels like she is having her normal panic attack.  We will give Ativan at this time.  Work-up today shows slightly elevated WBC 11.9, unremarkable CMP or lipase.  Urinalysis with no protein, small hgb. No urinary symptoms or back pain.   Upon reevaluation patient still tachycardic to 119, patient's boyfriend arrived and this appears to have worked patient up as well.  Will give IV Ativan at this time, patient is not hypoxic, no risk factors for PE. Is no longer on OCPs. If patient remains tachycardic after IV Ativan, I think a D-dimer and further work-up would be reasonable.  Pt care was handed off to J. Robinson PA-C at 330.  Complete history and physical and current plan have been communicated.  Please refer to their note for the remainder of ED care and ultimate disposition.  Will need reevaluation after IV Ativan, IV Ativan given at 330.  Final Clinical Impression(s) / ED Diagnoses Final diagnoses:  Anxiety attack    Rx / DC Orders ED Discharge Orders    None       Allena Katz,  Donne AnonShalyn, PA-C 12/22/20 1555    Maia PlanLong, Joshua G, MD 12/24/20 [phone]

## 2020-12-22 NOTE — ED Notes (Signed)
Delay in administration of hydralazine as pyxis is out of stock. Pharmacy contacted.

## 2020-12-22 NOTE — ED Triage Notes (Signed)
Pt presents with c/o anxiety. Pt has a hx of same, reports that it has been worse than usual since she got Covid the end of February. Denies SI/HI.

## 2020-12-22 NOTE — Discharge Instructions (Addendum)
Your work-up today was reassuring, your white blood cell was slightly elevated this is most likely from your vomiting this morning and anxiety level.  Want you to have your labs rechecked by your PCP in the next couple of days, you may need to adjust your blood pressure medications if you're not tolerating them well. Please speak to your primary care about this.  Try to limit your caffeine intake, as this can contribute to your worsening anxiety. You can take hydroxyzine every 6 hours for mild to moderate anxiety.  If you have more significant symptoms of anxiety, take 1-2tabs of the xanax as directed. Do not drive while taking this medication.   If you are unable to manage your anxiety symptoms at home, you can go to the [facility] for additional urgent assistance. They have 24/7 walk-in clinic.

## 2023-04-03 ENCOUNTER — Emergency Department (HOSPITAL_COMMUNITY)
Admission: EM | Admit: 2023-04-03 | Discharge: 2023-04-04 | Disposition: A | Discharge status: Home / Self Care | Payer: BLUE CROSS/BLUE SHIELD | Attending: Emergency Medicine | Admitting: Emergency Medicine

## 2023-04-03 ENCOUNTER — Encounter (HOSPITAL_COMMUNITY): Payer: Self-pay

## 2023-04-03 ENCOUNTER — Other Ambulatory Visit: Payer: Self-pay

## 2023-04-03 DIAGNOSIS — R0789 Other chest pain: Secondary | ICD-10-CM | POA: Diagnosis not present

## 2023-04-03 LAB — CBC
HCT: 44.8 % (ref 36.0–46.0)
Hemoglobin: 15 g/dL (ref 12.0–15.0)
MCH: 32.2 pg (ref 26.0–34.0)
MCHC: 33.5 g/dL (ref 30.0–36.0)
MCV: 96.1 fL (ref 80.0–100.0)
Platelets: 291 10*3/uL (ref 150–400)
RBC: 4.66 MIL/uL (ref 3.87–5.11)
RDW: 11.5 % (ref 11.5–15.5)
WBC: 11.8 10*3/uL — ABNORMAL HIGH (ref 4.0–10.5)
nRBC: 0 % (ref 0.0–0.2)

## 2023-04-03 LAB — COMPREHENSIVE METABOLIC PANEL
ALT: 36 U/L (ref 0–44)
AST: 24 U/L (ref 15–41)
Albumin: 4.6 g/dL (ref 3.5–5.0)
Alkaline Phosphatase: 102 U/L (ref 38–126)
Anion gap: 9 (ref 5–15)
BUN: 10 mg/dL (ref 6–20)
CO2: 25 mmol/L (ref 22–32)
Calcium: 9.5 mg/dL (ref 8.9–10.3)
Chloride: 104 mmol/L (ref 98–111)
Creatinine, Ser: 1.03 mg/dL — ABNORMAL HIGH (ref 0.44–1.00)
GFR, Estimated: >60 mL/min (ref >60)
Glucose, Bld: 106 mg/dL — ABNORMAL HIGH (ref 70–99)
Potassium: 4 mmol/L (ref 3.5–5.1)
Sodium: 138 mmol/L (ref 135–145)
Total Bilirubin: 0.6 mg/dL (ref 0.3–1.2)
Total Protein: 7.7 g/dL (ref 6.5–8.1)

## 2023-04-03 LAB — LIPASE, BLOOD: Lipase: 33 U/L (ref 11–51)

## 2023-04-03 LAB — I-STAT BETA HCG BLOOD, ED (MC, WL, AP ONLY): I-stat hCG, quantitative: <5 m[IU]/mL (ref <5)

## 2023-04-03 MED ORDER — ONDANSETRON 4 MG PO TBDP
4.0000 mg | ORAL_TABLET | Freq: Once | ORAL | Status: AC | PRN
  Administered 2023-04-03: 4 mg via ORAL
  Filled 2023-04-03: qty 1

## 2023-04-03 NOTE — ED Triage Notes (Addendum)
Pt. States that she feels like she is having really bad heart burn. Pt has had an EKG done in the past for this. HR is elevated in triage. She states that this could have been brought on by her anxiety. Pt. States that she has taken 20mg  of prilosec and 1 tums. Pt. States that she has also been feeling nauseous.

## 2023-04-04 ENCOUNTER — Emergency Department (HOSPITAL_COMMUNITY): Payer: BLUE CROSS/BLUE SHIELD

## 2023-04-04 LAB — URINALYSIS, ROUTINE W REFLEX MICROSCOPIC
Bacteria, UA: NONE SEEN
Bilirubin Urine: NEGATIVE
Glucose, UA: NEGATIVE mg/dL
Ketones, ur: NEGATIVE mg/dL
Leukocytes,Ua: NEGATIVE
Nitrite: NEGATIVE
Protein, ur: NEGATIVE mg/dL
Specific Gravity, Urine: 1.003 — ABNORMAL LOW (ref 1.005–1.030)
pH: 7 (ref 5.0–8.0)

## 2023-04-04 LAB — TROPONIN I (HIGH SENSITIVITY): Troponin I (High Sensitivity): 5 ng/L (ref <18)

## 2023-04-04 MED ORDER — ACETAMINOPHEN 325 MG PO TABS
650.0000 mg | ORAL_TABLET | Freq: Once | ORAL | Status: AC
  Administered 2023-04-04: 650 mg via ORAL
  Filled 2023-04-04: qty 2

## 2023-04-04 MED ORDER — ALUM & MAG HYDROXIDE-SIMETH 200-200-20 MG/5ML PO SUSP
15.0000 mL | Freq: Once | ORAL | Status: AC
  Administered 2023-04-04: 15 mL via ORAL
  Filled 2023-04-04: qty 30

## 2023-04-04 NOTE — ED Provider Notes (Signed)
Emergency Department Provider Note   I have reviewed the triage vital signs and the nursing notes.   HISTORY  Chief Complaint Heartburn   HPI Amber Parks is a 47 y.o. female presents to the emergency department evaluation of chest discomfort.  Symptoms began while at work.  She describes a burning type chest discomfort along with some pressure.  No shortness of breath.  She states that her blood pressure elevated along with elevated heart rate.  She notes prior history of anxiety as well with similar symptoms in the past.  She did take Prilosec prior to arrival.  She had some mild nausea but symptoms improved after Zofran.  At this point, has very little chest discomfort.  No pleuritic pain.  No pain or swelling in the legs.     Past Medical History:  Diagnosis Date   Anemia    Anxiety    Gall bladder inflammation    Infection    UTI   Vaginal Pap smear, abnormal    f/u ok    Review of Systems  Constitutional: No fever/chills Eyes: No visual changes. ENT: No sore throat. Cardiovascular: Positive chest pain. Respiratory: Denies shortness of breath. Gastrointestinal: No abdominal pain. Positive nausea, no vomiting.  No diarrhea.  No constipation. Genitourinary: Negative for dysuria. Musculoskeletal: Negative for back pain. Skin: Negative for rash. Neurological: Negative for headaches, focal weakness or numbness.   ____________________________________________   PHYSICAL EXAM:  VITAL SIGNS: ED Triage Vitals  Enc Vitals Group     BP 04/03/23 2054 (!) 154/106     Pulse Rate 04/03/23 2054 (!) 115     Resp 04/03/23 2054 20     Temp 04/03/23 2054 98.8 F (37.1 C)     Temp Source 04/03/23 2054 Oral     SpO2 04/03/23 2054 99 %   Constitutional: Alert and oriented. Well appearing and in no acute distress. Eyes: Conjunctivae are normal.  Head: Atraumatic. Nose: No congestion/rhinnorhea. Mouth/Throat: Mucous membranes are moist.   Neck: No stridor.    Cardiovascular: Tachycardia. Good peripheral circulation. Grossly normal heart sounds.   Respiratory: Normal respiratory effort.  No retractions. Lungs CTAB. Gastrointestinal: Soft and nontender. No distention.  Musculoskeletal: No lower extremity tenderness nor edema. No gross deformities of extremities. Neurologic:  Normal speech and language. No gross focal neurologic deficits are appreciated.  Skin:  Skin is warm, dry and intact. No rash noted.  ____________________________________________   LABS (all labs ordered are listed, but only abnormal results are displayed)  Labs Reviewed  COMPREHENSIVE METABOLIC PANEL - Abnormal; Notable for the following components:      Result Value   Glucose, Bld 106 (*)    Creatinine, Ser 1.03 (*)    All other components within normal limits  CBC - Abnormal; Notable for the following components:   WBC 11.8 (*)    All other components within normal limits  URINALYSIS, ROUTINE W REFLEX MICROSCOPIC - Abnormal; Notable for the following components:   Color, Urine COLORLESS (*)    Specific Gravity, Urine 1.003 (*)    Hgb urine dipstick MODERATE (*)    All other components within normal limits  LIPASE, BLOOD  I-STAT BETA HCG BLOOD, ED ([facility], [facility], [facility] ONLY)  TROPONIN I (HIGH SENSITIVITY)   ____________________________________________  EKG   EKG Interpretation  Date/Time:  Saturday April 03 2023 20:58:00 EDT Ventricular Rate:  112 PR Interval:  141 QRS Duration: 87 QT Interval:  322 QTC Calculation: 440 R Axis:   95 Text Interpretation: Sinus  tachycardia Borderline right axis deviation Confirmed by Alona Bene [phone]) on 04/04/2023 12:10:23 AM        ____________________________________________  RADIOLOGY  No results found.  ____________________________________________   PROCEDURES  Procedure(s) performed:   Procedures   ____________________________________________   INITIAL IMPRESSION / ASSESSMENT AND PLAN / ED  COURSE  Pertinent labs & imaging results that were available during my care of the patient were reviewed by me and considered in my medical decision making (see chart for details).   This patient is Presenting for Evaluation of CP, which does require a range of treatment options, and is a complaint that involves a high risk of morbidity and mortality.  The Differential Diagnoses includes but is not exclusive to acute coronary syndrome, aortic dissection, pulmonary embolism, cardiac tamponade, community-acquired pneumonia, pericarditis, musculoskeletal chest wall pain, etc.   Critical Interventions-    Medications  alum & mag hydroxide-simeth (MAALOX/MYLANTA) 200-200-20 MG/5ML suspension 15 mL (has no administration in time range)  ondansetron (ZOFRAN-ODT) disintegrating tablet 4 mg (4 mg Oral Given 04/03/23 2120)    Reassessment after intervention:  HR improved. No active CP.    Clinical Laboratory Tests Ordered, included CMP with normal LFTs and lipase. No anemia.   Radiologic Tests Ordered, included CXR. I independently interpreted the images and agree with radiology interpretation.   Cardiac Monitor Tracing which shows sinus tachycardia.    Social Determinants of Health Risk patient is a non-smoker.   Consult complete with  Medical Decision Making: Summary:  Patient presents emergency department for evaluation of chest discomfort.  Is fairly atypical for ACS or PE although these diagnoses were considered.  Heart rate improved with Zofran symptom management.  We did discuss screening troponin and x-ray which were not ordered in triage.  Labs are overall reassuring.  Reevaluation with update and discussion with   ***Considered admission***  Patient's presentation is most consistent with acute presentation with potential threat to life or bodily function.   Disposition:   ____________________________________________  FINAL CLINICAL IMPRESSION(S) / ED DIAGNOSES  Final  diagnoses:  None     NEW OUTPATIENT MEDICATIONS STARTED DURING THIS VISIT:  New Prescriptions   No medications on file    Note:  This document was prepared using Dragon voice recognition software and may include unintentional dictation errors.  Alona Bene, MD, [facility]

## 2023-04-04 NOTE — Discharge Instructions (Signed)
